# Patient Record
Sex: Female | Born: 1990 | Race: White | Hispanic: No | Marital: Single | Smoking: Never smoker
Health system: Southern US, Community
[De-identification: ages and names within clinical notes are randomized; demographics above are authoritative.]

## PROBLEM LIST (undated history)

## (undated) DIAGNOSIS — D649 Anemia, unspecified: Secondary | ICD-10-CM

## (undated) DIAGNOSIS — R000 Tachycardia, unspecified: Secondary | ICD-10-CM

## (undated) DIAGNOSIS — J45909 Unspecified asthma, uncomplicated: Secondary | ICD-10-CM

## (undated) DIAGNOSIS — Z9889 Other specified postprocedural states: Secondary | ICD-10-CM

## (undated) DIAGNOSIS — N189 Chronic kidney disease, unspecified: Secondary | ICD-10-CM

## (undated) DIAGNOSIS — G90A Postural orthostatic tachycardia syndrome (POTS): Secondary | ICD-10-CM

## (undated) DIAGNOSIS — I498 Other specified cardiac arrhythmias: Secondary | ICD-10-CM

## (undated) DIAGNOSIS — I951 Orthostatic hypotension: Secondary | ICD-10-CM

## (undated) DIAGNOSIS — R112 Nausea with vomiting, unspecified: Secondary | ICD-10-CM

## (undated) DIAGNOSIS — R51 Headache: Secondary | ICD-10-CM

## (undated) DIAGNOSIS — C801 Malignant (primary) neoplasm, unspecified: Secondary | ICD-10-CM

## (undated) DIAGNOSIS — R519 Headache, unspecified: Secondary | ICD-10-CM

## (undated) DIAGNOSIS — Z8679 Personal history of other diseases of the circulatory system: Secondary | ICD-10-CM

---
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
## 2013-10-29 DIAGNOSIS — H9191 Unspecified hearing loss, right ear: Secondary | ICD-10-CM | POA: Insufficient documentation

## 2013-11-04 ENCOUNTER — Ambulatory Visit: Payer: Self-pay | Admitting: Internal Medicine

## 2014-08-01 ENCOUNTER — Ambulatory Visit: Payer: Self-pay | Admitting: Neurology

## 2015-05-05 ENCOUNTER — Other Ambulatory Visit: Payer: Self-pay | Admitting: Surgery

## 2015-05-05 DIAGNOSIS — M25561 Pain in right knee: Secondary | ICD-10-CM

## 2015-05-26 ENCOUNTER — Ambulatory Visit
Admission: RE | Admit: 2015-05-26 | Discharge: 2015-05-26 | Disposition: A | Discharge status: Home / Self Care | Payer: BC Managed Care – PPO | Source: Ambulatory Visit | Attending: Surgery | Admitting: Surgery

## 2015-05-26 DIAGNOSIS — M2241 Chondromalacia patellae, right knee: Secondary | ICD-10-CM | POA: Insufficient documentation

## 2015-05-26 DIAGNOSIS — M25561 Pain in right knee: Secondary | ICD-10-CM | POA: Insufficient documentation

## 2015-06-06 ENCOUNTER — Encounter
Admission: RE | Admit: 2015-06-06 | Discharge: 2015-06-06 | Disposition: A | Discharge status: Home / Self Care | Payer: BC Managed Care – PPO | Source: Ambulatory Visit | Attending: Surgery | Admitting: Surgery

## 2015-06-06 DIAGNOSIS — I498 Other specified cardiac arrhythmias: Secondary | ICD-10-CM | POA: Diagnosis not present

## 2015-06-06 DIAGNOSIS — Z79899 Other long term (current) drug therapy: Secondary | ICD-10-CM | POA: Diagnosis not present

## 2015-06-06 DIAGNOSIS — Z8349 Family history of other endocrine, nutritional and metabolic diseases: Secondary | ICD-10-CM | POA: Diagnosis not present

## 2015-06-06 DIAGNOSIS — Z833 Family history of diabetes mellitus: Secondary | ICD-10-CM | POA: Diagnosis not present

## 2015-06-06 DIAGNOSIS — Z87892 Personal history of anaphylaxis: Secondary | ICD-10-CM | POA: Diagnosis not present

## 2015-06-06 DIAGNOSIS — Z823 Family history of stroke: Secondary | ICD-10-CM | POA: Diagnosis not present

## 2015-06-06 DIAGNOSIS — Z9889 Other specified postprocedural states: Secondary | ICD-10-CM | POA: Diagnosis not present

## 2015-06-06 DIAGNOSIS — X58XXXA Exposure to other specified factors, initial encounter: Secondary | ICD-10-CM | POA: Diagnosis not present

## 2015-06-06 DIAGNOSIS — Z8249 Family history of ischemic heart disease and other diseases of the circulatory system: Secondary | ICD-10-CM | POA: Diagnosis not present

## 2015-06-06 DIAGNOSIS — Z881 Allergy status to other antibiotic agents status: Secondary | ICD-10-CM | POA: Diagnosis not present

## 2015-06-06 DIAGNOSIS — Z85820 Personal history of malignant melanoma of skin: Secondary | ICD-10-CM | POA: Diagnosis not present

## 2015-06-06 DIAGNOSIS — Z91041 Radiographic dye allergy status: Secondary | ICD-10-CM | POA: Diagnosis not present

## 2015-06-06 DIAGNOSIS — Z832 Family history of diseases of the blood and blood-forming organs and certain disorders involving the immune mechanism: Secondary | ICD-10-CM | POA: Diagnosis not present

## 2015-06-06 DIAGNOSIS — S83221A Peripheral tear of medial meniscus, current injury, right knee, initial encounter: Secondary | ICD-10-CM | POA: Diagnosis not present

## 2015-06-06 HISTORY — DX: Postural orthostatic tachycardia syndrome (POTS): G90.A

## 2015-06-06 HISTORY — DX: Tachycardia, unspecified: R00.0

## 2015-06-06 HISTORY — DX: Other specified cardiac arrhythmias: I49.8

## 2015-06-06 HISTORY — DX: Orthostatic hypotension: I95.1

## 2015-06-06 HISTORY — DX: Nausea with vomiting, unspecified: R11.2

## 2015-06-06 HISTORY — DX: Personal history of other diseases of the circulatory system: Z86.79

## 2015-06-06 HISTORY — DX: Malignant (primary) neoplasm, unspecified: C80.1

## 2015-06-06 HISTORY — DX: Headache, unspecified: R51.9

## 2015-06-06 HISTORY — DX: Headache: R51

## 2015-06-06 HISTORY — DX: Anemia, unspecified: D64.9

## 2015-06-06 HISTORY — DX: Unspecified asthma, uncomplicated: J45.909

## 2015-06-06 HISTORY — DX: Other specified postprocedural states: Z98.890

## 2015-06-06 HISTORY — DX: Chronic kidney disease, unspecified: N18.9

## 2015-06-06 LAB — HEMOGLOBIN: Hemoglobin: 13.1 g/dL (ref 12.0–16.0)

## 2015-06-06 NOTE — Patient Instructions (Signed)
  Your procedure is scheduled on: December 22,2106 (Thursday) Report to Day Surgery.[facility]) Second Floor To find out your arrival time please call [phone] between 1PM - 3PM on June 07, 2015 (Wednesday).  Remember: Instructions that are not followed completely may result in serious medical risk, up to and including death, or upon the discretion of your surgeon and anesthesiologist your surgery may need to be rescheduled.    __x__ 1. Do not eat food or drink liquids after midnight. No gum chewing or hard candies.     __x__ 2. No Alcohol for 24 hours before or after surgery.   ____ 3. Bring all medications with you on the day of surgery if instructed.    __x__ 4. Notify your doctor if there is any change in your medical condition     (cold, fever, infections).     Do not wear jewelry, make-up, hairpins, clips or nail polish.  Do not wear lotions, powders, or perfumes. You may wear deodorant.  Do not shave 48 hours prior to surgery. Men may shave face and neck.  Do not bring valuables to the hospital.    [facility] is not responsible for any belongings or valuables.               Contacts, dentures or bridgework may not be worn into surgery.  Leave your suitcase in the car. After surgery it may be brought to your room.  For patients admitted to the hospital, discharge time is determined by your                treatment team.   Patients discharged the day of surgery will not be allowed to drive home.   Please read over the following fact sheets that you were given:   Surgical Site Infection Prevention   __x__ Take these medicines the morning of surgery with A SIP OF WATER:    1. Propranolol  2.   3.   4.  5.  6.  ____ Fleet Enema (as directed)   _x__ Use CHG Soap as directed  ____ Use inhalers on the day of surgery  ____ Stop metformin 2 days prior to surgery    ____ Take 1/2 of usual insulin dose the night before surgery and none on the morning of  surgery.   ____ Stop Coumadin/Plavix/aspirin on   __x_ Stop Anti-inflammatories on (Stop Ibuprofen now)   __x_ Stop supplements until after surgery.  (Stop Cranberry and Vitamin B-12 now)  ____ Bring C-Pap to the hospital.

## 2015-06-08 ENCOUNTER — Encounter: Payer: Self-pay | Admitting: *Deleted

## 2015-06-08 ENCOUNTER — Encounter
Admission: RE | Disposition: A | Discharge status: Home / Self Care | Payer: Self-pay | Source: Ambulatory Visit | Attending: Surgery

## 2015-06-08 ENCOUNTER — Ambulatory Visit: Payer: BC Managed Care – PPO | Admitting: Certified Registered Nurse Anesthetist

## 2015-06-08 ENCOUNTER — Ambulatory Visit
Admission: RE | Admit: 2015-06-08 | Discharge: 2015-06-08 | Disposition: A | Discharge status: Home / Self Care | Payer: BC Managed Care – PPO | Source: Ambulatory Visit | Attending: Surgery | Admitting: Surgery

## 2015-06-08 DIAGNOSIS — X58XXXA Exposure to other specified factors, initial encounter: Secondary | ICD-10-CM | POA: Insufficient documentation

## 2015-06-08 DIAGNOSIS — Z85820 Personal history of malignant melanoma of skin: Secondary | ICD-10-CM | POA: Insufficient documentation

## 2015-06-08 DIAGNOSIS — Z832 Family history of diseases of the blood and blood-forming organs and certain disorders involving the immune mechanism: Secondary | ICD-10-CM | POA: Insufficient documentation

## 2015-06-08 DIAGNOSIS — Z823 Family history of stroke: Secondary | ICD-10-CM | POA: Insufficient documentation

## 2015-06-08 DIAGNOSIS — I498 Other specified cardiac arrhythmias: Secondary | ICD-10-CM | POA: Insufficient documentation

## 2015-06-08 DIAGNOSIS — Z9889 Other specified postprocedural states: Secondary | ICD-10-CM | POA: Insufficient documentation

## 2015-06-08 DIAGNOSIS — Z8249 Family history of ischemic heart disease and other diseases of the circulatory system: Secondary | ICD-10-CM | POA: Insufficient documentation

## 2015-06-08 DIAGNOSIS — Z79899 Other long term (current) drug therapy: Secondary | ICD-10-CM | POA: Insufficient documentation

## 2015-06-08 DIAGNOSIS — S83221A Peripheral tear of medial meniscus, current injury, right knee, initial encounter: Secondary | ICD-10-CM | POA: Insufficient documentation

## 2015-06-08 DIAGNOSIS — Z833 Family history of diabetes mellitus: Secondary | ICD-10-CM | POA: Insufficient documentation

## 2015-06-08 DIAGNOSIS — Z881 Allergy status to other antibiotic agents status: Secondary | ICD-10-CM | POA: Insufficient documentation

## 2015-06-08 DIAGNOSIS — Z87892 Personal history of anaphylaxis: Secondary | ICD-10-CM | POA: Insufficient documentation

## 2015-06-08 DIAGNOSIS — Z8349 Family history of other endocrine, nutritional and metabolic diseases: Secondary | ICD-10-CM | POA: Insufficient documentation

## 2015-06-08 DIAGNOSIS — Z91041 Radiographic dye allergy status: Secondary | ICD-10-CM | POA: Insufficient documentation

## 2015-06-08 HISTORY — PX: KNEE ARTHROSCOPY WITH MENISCAL REPAIR: SHX5653

## 2015-06-08 LAB — POCT PREGNANCY, URINE: Preg Test, Ur: NEGATIVE

## 2015-06-08 SURGERY — ARTHROSCOPY, KNEE, WITH MENISCUS REPAIR
Anesthesia: General | Site: Knee | Laterality: Right | Wound class: Clean

## 2015-06-08 MED ORDER — EPHEDRINE SULFATE 50 MG/ML IJ SOLN
INTRAMUSCULAR | Status: DC | PRN
  Administered 2015-06-08: 5 mg via INTRAVENOUS

## 2015-06-08 MED ORDER — HYDROCODONE-ACETAMINOPHEN 5-325 MG PO TABS: 1.0000 | ORAL_TABLET | Freq: Four times a day (QID) | ORAL | Status: DC | PRN

## 2015-06-08 MED ORDER — PROPOFOL 10 MG/ML IV BOLUS
INTRAVENOUS | Status: DC | PRN
  Administered 2015-06-08: 150 mg via INTRAVENOUS

## 2015-06-08 MED ORDER — LIDOCAINE HCL (CARDIAC) 20 MG/ML IV SOLN
INTRAVENOUS | Status: DC | PRN
  Administered 2015-06-08: 100 mg via INTRAVENOUS

## 2015-06-08 MED ORDER — ONDANSETRON HCL 4 MG/2ML IJ SOLN: 4.0000 mg | Freq: Once | INTRAMUSCULAR | Status: DC | PRN

## 2015-06-08 MED ORDER — ONDANSETRON HCL 4 MG PO TABS: 4.0000 mg | ORAL_TABLET | Freq: Four times a day (QID) | ORAL | Status: DC | PRN

## 2015-06-08 MED ORDER — FENTANYL CITRATE (PF) 100 MCG/2ML IJ SOLN
INTRAMUSCULAR | Status: DC | PRN
  Administered 2015-06-08 (×2): 50 ug via INTRAVENOUS

## 2015-06-08 MED ORDER — PHENYLEPHRINE HCL 10 MG/ML IJ SOLN
INTRAMUSCULAR | Status: DC | PRN
  Administered 2015-06-08: 100 ug via INTRAVENOUS

## 2015-06-08 MED ORDER — SCOPOLAMINE 1 MG/3DAYS TD PT72
MEDICATED_PATCH | TRANSDERMAL | Status: AC
  Administered 2015-06-08: 1.5 mg via TRANSDERMAL
  Filled 2015-06-08: qty 1

## 2015-06-08 MED ORDER — MIDAZOLAM HCL 2 MG/2ML IJ SOLN
INTRAMUSCULAR | Status: DC | PRN
  Administered 2015-06-08: 2 mg via INTRAVENOUS

## 2015-06-08 MED ORDER — METOCLOPRAMIDE HCL 5 MG/ML IJ SOLN: 5.0000 mg | Freq: Three times a day (TID) | INTRAMUSCULAR | Status: DC | PRN

## 2015-06-08 MED ORDER — LACTATED RINGERS IV SOLN
INTRAVENOUS | Status: DC
  Administered 2015-06-08: 75 mL/h via INTRAVENOUS

## 2015-06-08 MED ORDER — ONDANSETRON HCL 4 MG/2ML IJ SOLN: 4.0000 mg | Freq: Four times a day (QID) | INTRAMUSCULAR | Status: DC | PRN

## 2015-06-08 MED ORDER — ACETAMINOPHEN 10 MG/ML IV SOLN
INTRAVENOUS | Status: DC | PRN
  Administered 2015-06-08: 1000 mg via INTRAVENOUS

## 2015-06-08 MED ORDER — ACETAMINOPHEN 10 MG/ML IV SOLN
INTRAVENOUS | Status: AC
  Filled 2015-06-08: qty 100

## 2015-06-08 MED ORDER — BUPIVACAINE-EPINEPHRINE (PF) 0.5% -1:200000 IJ SOLN
INTRAMUSCULAR | Status: DC | PRN
  Administered 2015-06-08: 15 mL via PERINEURAL
  Administered 2015-06-08: 30 mL via PERINEURAL

## 2015-06-08 MED ORDER — FAMOTIDINE 20 MG PO TABS
20.0000 mg | ORAL_TABLET | Freq: Once | ORAL | Status: AC
  Administered 2015-06-08: 20 mg via ORAL

## 2015-06-08 MED ORDER — HYDROCODONE-ACETAMINOPHEN 5-325 MG PO TABS: 1.0000 | ORAL_TABLET | ORAL | Status: DC | PRN

## 2015-06-08 MED ORDER — LIDOCAINE HCL (PF) 1 % IJ SOLN
INTRAMUSCULAR | Status: AC
  Filled 2015-06-08: qty 2

## 2015-06-08 MED ORDER — CLINDAMYCIN PHOSPHATE 900 MG/50ML IV SOLN
INTRAVENOUS | Status: AC
  Filled 2015-06-08: qty 50

## 2015-06-08 MED ORDER — POTASSIUM CHLORIDE IN NACL 20-0.9 MEQ/L-% IV SOLN: INTRAVENOUS | Status: DC

## 2015-06-08 MED ORDER — LIDOCAINE HCL (PF) 1 % IJ SOLN
INTRAMUSCULAR | Status: AC
  Filled 2015-06-08: qty 30

## 2015-06-08 MED ORDER — METOCLOPRAMIDE HCL 10 MG PO TABS
5.0000 mg | ORAL_TABLET | Freq: Three times a day (TID) | ORAL | Status: DC | PRN
Start: 2015-06-08 — End: 2015-06-08

## 2015-06-08 MED ORDER — DEXAMETHASONE SODIUM PHOSPHATE 10 MG/ML IJ SOLN
INTRAMUSCULAR | Status: DC | PRN
  Administered 2015-06-08: 5 mg via INTRAVENOUS

## 2015-06-08 MED ORDER — ONDANSETRON HCL 4 MG/2ML IJ SOLN
INTRAMUSCULAR | Status: DC | PRN
  Administered 2015-06-08: 4 mg via INTRAVENOUS

## 2015-06-08 MED ORDER — FAMOTIDINE 20 MG PO TABS
ORAL_TABLET | ORAL | Status: AC
  Administered 2015-06-08: 20 mg via ORAL
  Filled 2015-06-08: qty 1

## 2015-06-08 MED ORDER — BUPIVACAINE-EPINEPHRINE (PF) 0.5% -1:200000 IJ SOLN
INTRAMUSCULAR | Status: AC
  Filled 2015-06-08: qty 60

## 2015-06-08 MED ORDER — CLINDAMYCIN PHOSPHATE 900 MG/50ML IV SOLN
900.0000 mg | Freq: Once | INTRAVENOUS | Status: AC
Start: 2015-06-08 — End: 2015-06-08
  Administered 2015-06-08: 900 mg via INTRAVENOUS

## 2015-06-08 MED ORDER — IBUPROFEN 200 MG PO TABS: 600.0000 mg | ORAL_TABLET | Freq: Four times a day (QID) | ORAL | Status: DC | PRN

## 2015-06-08 MED ORDER — LIDOCAINE HCL 1 % IJ SOLN
INTRAMUSCULAR | Status: DC | PRN
  Administered 2015-06-08: 30 mL

## 2015-06-08 MED ORDER — SCOPOLAMINE 1 MG/3DAYS TD PT72
1.0000 | MEDICATED_PATCH | TRANSDERMAL | Status: DC
  Administered 2015-06-08: 1.5 mg via TRANSDERMAL

## 2015-06-08 MED ORDER — FENTANYL CITRATE (PF) 100 MCG/2ML IJ SOLN: 25.0000 ug | INTRAMUSCULAR | Status: DC | PRN

## 2015-06-08 SURGICAL SUPPLY — 35 items
BAG COUNTER SPONGE EZ (MISCELLANEOUS) IMPLANT
BANDAGE ACE 6X5 VEL STRL LF (GAUZE/BANDAGES/DRESSINGS) ×3 IMPLANT
BLADE FULL RADIUS 3.5 (BLADE) ×3 IMPLANT
BLADE SHAVER 4.5X7 STR FR (MISCELLANEOUS) ×3 IMPLANT
CHLORAPREP W/TINT 26ML (MISCELLANEOUS) ×3 IMPLANT
COUNTER SPONGE BAG EZ (MISCELLANEOUS)
FASTFIX NDL DEL SYS 360 CVD (Miscellaneous) ×9 IMPLANT
FASTFIX NDL DEL SYS 360 STRT (Miscellaneous) ×6 IMPLANT
GAUZE SPONGE 4X4 12PLY STRL (GAUZE/BANDAGES/DRESSINGS) ×3 IMPLANT
GLOVE BIO SURGEON STRL SZ8 (GLOVE) ×6 IMPLANT
GLOVE BIOGEL M 7.0 STRL (GLOVE) ×6 IMPLANT
GLOVE BIOGEL PI IND STRL 7.5 (GLOVE) ×1 IMPLANT
GLOVE BIOGEL PI INDICATOR 7.5 (GLOVE) ×2
GLOVE INDICATOR 8.0 STRL GRN (GLOVE) ×3 IMPLANT
GOWN STRL REUS W/ TWL LRG LVL3 (GOWN DISPOSABLE) ×1 IMPLANT
GOWN STRL REUS W/ TWL XL LVL3 (GOWN DISPOSABLE) ×2 IMPLANT
GOWN STRL REUS W/TWL LRG LVL3 (GOWN DISPOSABLE) ×2
GOWN STRL REUS W/TWL XL LVL3 (GOWN DISPOSABLE) ×4
IV LACTATED RINGER IRRG 3000ML (IV SOLUTION) ×2
IV LR IRRIG 3000ML ARTHROMATIC (IV SOLUTION) ×1 IMPLANT
KIT RM TURNOVER STRD PROC AR (KITS) ×3 IMPLANT
KNOT PUSHER IMPLANT
MANIFOLD NEPTUNE II (INSTRUMENTS) ×3 IMPLANT
NEEDLE HYPO 21X1.5 SAFETY (NEEDLE) ×3 IMPLANT
PACK ARTHROSCOPY KNEE (MISCELLANEOUS) ×3 IMPLANT
PAD GROUND ADULT SPLIT (MISCELLANEOUS) ×3 IMPLANT
PENCIL ELECTRO HAND CTR (MISCELLANEOUS) ×3 IMPLANT
PUSHER KNOT ARTHRO STRT FASTFI (Miscellaneous) ×3 IMPLANT
SUT PROLENE 4 0 PS 2 18 (SUTURE) ×3 IMPLANT
SUT TI-CRON 2-0 W/10 SWGD (SUTURE) IMPLANT
SYR 50ML LL SCALE MARK (SYRINGE) ×3 IMPLANT
SYSTEM NDL DEL FSTFX  360 CVD (Miscellaneous) ×3 IMPLANT
SYSTEM NDL DEL FSTFX  360 STRT (Miscellaneous) ×2 IMPLANT
TUBING ARTHRO INFLOW-ONLY STRL (TUBING) ×3 IMPLANT
WAND HAND CNTRL MULTIVAC 90 (MISCELLANEOUS) ×3 IMPLANT

## 2015-06-08 NOTE — Anesthesia Preprocedure Evaluation (Signed)
Anesthesia Evaluation  Patient identified by MRN, date of birth, ID band Patient awake    Reviewed: Allergy & Precautions, NPO status , Patient's Chart, lab work & pertinent test results  History of Anesthesia Complications (+) PONV  Airway Mallampati: II       Dental  (+) Teeth Intact   Pulmonary    breath sounds clear to auscultation       Cardiovascular Exercise Tolerance: Good + dysrhythmias Supra Ventricular Tachycardia  Rhythm:Regular Rate:Normal     Neuro/Psych    GI/Hepatic negative GI ROS, Neg liver ROS,   Endo/Other    Renal/GU      Musculoskeletal   Abdominal Normal abdominal exam  (+)   Peds  Hematology  (+) anemia ,   Anesthesia Other Findings   Reproductive/Obstetrics                             Anesthesia Physical Anesthesia Plan  ASA: II  Anesthesia Plan: General   Post-op Pain Management:    Induction: Intravenous  Airway Management Planned: LMA  Additional Equipment:   Intra-op Plan:   Post-operative Plan: Extubation in OR  Informed Consent: I have reviewed the patients History and Physical, chart, labs and discussed the procedure including the risks, benefits and alternatives for the proposed anesthesia with the patient or authorized representative who has indicated his/her understanding and acceptance.     Plan Discussed with: CRNA  Anesthesia Plan Comments:         Anesthesia Quick Evaluation

## 2015-06-08 NOTE — Anesthesia Postprocedure Evaluation (Signed)
Anesthesia Post Note  Patient: Sarah Cohen  Procedure(s) Performed: Procedure(s) (LRB): KNEE ARTHROSCOPY WITH MEDIAL  MENISCAL REPAIR (Right)  Patient location during evaluation: PACU Anesthesia Type: General Level of consciousness: awake Pain management: pain level controlled Vital Signs Assessment: post-procedure vital signs reviewed and stable Respiratory status: spontaneous breathing Cardiovascular status: stable    Last Vitals:  Filed Vitals:   06/08/15 1024 06/08/15 1054  BP: 106/76 113/83  Pulse: 63 78  Temp: 36.5 C   Resp: 16 16    Last Pain:  Filed Vitals:   06/08/15 1103  PainSc: 3                  VAN STAVEREN,Gresia Isidoro

## 2015-06-08 NOTE — H&P (Signed)
Paper H&P to be scanned into permanent record. H&P reviewed. No changes. 

## 2015-06-08 NOTE — Anesthesia Procedure Notes (Signed)
Procedure Name: LMA Insertion Date/Time: 06/08/2015 8:28 AM Performed by: Johnna Acosta Pre-anesthesia Checklist: Patient identified, Emergency Drugs available, Suction available and Patient being monitored Patient Re-evaluated:Patient Re-evaluated prior to inductionOxygen Delivery Method: Circle system utilized Preoxygenation: Pre-oxygenation with 100% oxygen Intubation Type: IV induction LMA: LMA inserted LMA Size: 3.5 Tube type: Oral Number of attempts: 1 Placement Confirmation: ETT inserted through vocal cords under direct vision and positive ETCO2 Tube secured with: Tape Dental Injury: Teeth and Oropharynx as per pre-operative assessment

## 2015-06-08 NOTE — Transfer of Care (Signed)
Immediate Anesthesia Transfer of Care Note  Patient: Sarah Cohen  Procedure(s) Performed: Procedure(s): KNEE ARTHROSCOPY WITH MEDIAL  MENISCAL REPAIR (Right)  Patient Location: PACU  Anesthesia Type:General  Level of Consciousness: awake and alert   Airway & Oxygen Therapy: Patient Spontanous Breathing and Patient connected to nasal cannula oxygen  Post-op Assessment: Report given to RN and Post -op Vital signs reviewed and stable  Post vital signs: Reviewed and stable  Last Vitals:  Filed Vitals:   06/08/15 0733  BP: 116/81  Pulse: 71  Temp: 123XX123 C    Complications: No apparent anesthesia complications

## 2015-06-08 NOTE — Op Note (Signed)
06/08/2015  9:30 AM  Patient:   Sarah Cohen  Pre-Op Diagnosis:   Peripheral tear of medial meniscus, right knee.  Postoperative diagnosis:   Same.  Procedure:   Arthroscopic medial meniscus repair, right knee.  Surgeon:   Pascal Lux, M.D.  Anesthesia:   General LMA.  Findings:   As above. The lateral meniscus was in excellent condition, as were the anterior and posterior cruciate ligaments. The articular surfaces of the femur, the tibia, and the patella all were in satisfactory condition.  Complications:   None.  EBL:   2 cc.  Total fluids:   700 cc of crystalloid.  Tourniquet time:   None  Drains:   None  Closure:   4-0 Prolene interrupted sutures.  Brief clinical note:   The patient is a 24 year old female with a several month history of medial sided right knee pain following a twisting injury. Her symptoms have persisted despite medications, activity modification, etc. Her history and examination were consistent with a medial meniscus tear. An MRI scan demonstrated a peripheral menisco-capsular disruption involving the postero-medial portion of the medial meniscus. The patient presents at this time for arthroscopy, debridement, and repair versus partial medial meniscectomy.  Procedure:   The patient was brought into the operating room and lain in the supine position. After adequate general laryngal mask anesthesia was obtained, a timeout was performed to verify the appropriate side. The patient's right knee was injected sterilely using a solution of 30 cc of 1% lidocaine and 30 cc of 0.5% Sensorcaine with epinephrine. The right lower extremity was prepped with ChloraPrep solution before being draped sterilely. Preoperative antibiotics were administered. The expected portal sites were injected with 0.5% Sensorcaine with epinephrine before the camera was placed in the anterolateral portal and instrumentation performed through the anteromedial portal. The knee was  sequentially examined beginning in the suprapatellar pouch, then progressing to the patellofemoral space, the medial gutter compartment, the notch, and finally the lateral compartment and gutter. The findings were as described above. Abundant reactive synovial tissues anteriorly were debrided using the full-radius resector in order to improve visualization. The medial meniscus was carefully probed, confirming the above-noted findings. The menisco-capsular detachment site was roughened with a full-radius resector and a meniscal rasp to stimulate bleeding before the meniscus was repaired using several FasT-Fix 360 meniscal repair devices. Subsequent probing of the repair demonstrated excellent stability. The instruments were removed from the joint after suctioning the excess fluid. The portal sites were closed using 4-0 Prolene interrupted sutures before a sterile bulky dressing was applied to the knee. The patient was then awakened, extubated, and returned to the recovery room in satisfactory condition after tolerating the procedure well.

## 2015-06-08 NOTE — Discharge Instructions (Addendum)
Keep dressing dry and intact.  May shower after dressing changed on post-op day #4 (Monday).  Cover sutures with Band-Aids after drying off. Apply ice frequently to knee. May weight-bear as tolerated - use crutches as needed. Follow-up in 10-14 days or as scheduled.   General Anesthesia, Adult, Care After Refer to this sheet in the next few weeks. These instructions provide you with information on caring for yourself after your procedure. Your health care provider may also give you more specific instructions. Your treatment has been planned according to current medical practices, but problems sometimes occur. Call your health care provider if you have any problems or questions after your procedure. WHAT TO EXPECT AFTER THE PROCEDURE After the procedure, it is typical to experience:  Sleepiness.  Nausea and vomiting. HOME CARE INSTRUCTIONS  For the first 24 hours after general anesthesia:  Have a responsible person with you.  Do not drive a car. If you are alone, do not take public transportation.  Do not drink alcohol.  Do not take medicine that has not been prescribed by your health care provider.  Do not sign important papers or make important decisions.  You may resume a normal diet and activities as directed by your health care provider.  Change bandages (dressings) as directed.  If you have questions or problems that seem related to general anesthesia, call the hospital and ask for the anesthetist or anesthesiologist on call. SEEK MEDICAL CARE IF:  You have nausea and vomiting that continue the day after anesthesia.  You develop a rash. SEEK IMMEDIATE MEDICAL CARE IF:   You have difficulty breathing.  You have chest pain.  You have any allergic problems.   This information is not intended to replace advice given to you by your health care provider. Make sure you discuss any questions you have with your health care provider.   Document Released: 09/09/2000  Document Revised: 06/24/2014 Document Reviewed: 10/02/2011 Elsevier Interactive Patient Education 2016 [place] Anesthesia, Adult, Care After Refer to this sheet in the next few weeks. These instructions provide you with information on caring for yourself after your procedure. Your health care provider may also give you more specific instructions. Your treatment has been planned according to current medical practices, but problems sometimes occur. Call your health care provider if you have any problems or questions after your procedure. WHAT TO EXPECT AFTER THE PROCEDURE After the procedure, it is typical to experience:  Sleepiness.  Nausea and vomiting. HOME CARE INSTRUCTIONS  For the first 24 hours after general anesthesia:  Have a responsible person with you.  Do not drive a car. If you are alone, do not take public transportation.  Do not drink alcohol.  Do not take medicine that has not been prescribed by your health care provider.  Do not sign important papers or make important decisions.  You may resume a normal diet and activities as directed by your health care provider.  Change bandages (dressings) as directed.  If you have questions or problems that seem related to general anesthesia, call the hospital and ask for the anesthetist or anesthesiologist on call. SEEK MEDICAL CARE IF:  You have nausea and vomiting that continue the day after anesthesia.  You develop a rash. SEEK IMMEDIATE MEDICAL CARE IF:   You have difficulty breathing.  You have chest pain.  You have any allergic problems.   This information is not intended to replace advice given to you by your health care provider. Make sure you  discuss any questions you have with your health care provider.   Document Released: 09/09/2000 Document Revised: 06/24/2014 Document Reviewed: 10/02/2011 Elsevier Interactive Patient Education [employer].

## 2016-06-28 ENCOUNTER — Other Ambulatory Visit: Payer: Self-pay | Admitting: Internal Medicine

## 2016-06-28 ENCOUNTER — Ambulatory Visit
Admission: RE | Admit: 2016-06-28 | Discharge: 2016-06-28 | Disposition: A | Discharge status: Home / Self Care | Payer: BC Managed Care – PPO | Source: Ambulatory Visit | Attending: Internal Medicine | Admitting: Internal Medicine

## 2016-06-28 DIAGNOSIS — R1011 Right upper quadrant pain: Secondary | ICD-10-CM | POA: Diagnosis present

## 2016-07-05 ENCOUNTER — Encounter: Payer: Self-pay | Admitting: Intensive Care

## 2016-07-05 ENCOUNTER — Emergency Department: Payer: BC Managed Care – PPO

## 2016-07-05 ENCOUNTER — Emergency Department
Admission: EM | Admit: 2016-07-05 | Discharge: 2016-07-05 | Disposition: A | Discharge status: Home / Self Care | Payer: BC Managed Care – PPO | Attending: Emergency Medicine | Admitting: Emergency Medicine

## 2016-07-05 DIAGNOSIS — Z791 Long term (current) use of non-steroidal anti-inflammatories (NSAID): Secondary | ICD-10-CM | POA: Insufficient documentation

## 2016-07-05 DIAGNOSIS — N200 Calculus of kidney: Secondary | ICD-10-CM | POA: Insufficient documentation

## 2016-07-05 DIAGNOSIS — R51 Headache: Secondary | ICD-10-CM | POA: Insufficient documentation

## 2016-07-05 DIAGNOSIS — R109 Unspecified abdominal pain: Secondary | ICD-10-CM | POA: Diagnosis present

## 2016-07-05 DIAGNOSIS — J45909 Unspecified asthma, uncomplicated: Secondary | ICD-10-CM | POA: Diagnosis not present

## 2016-07-05 DIAGNOSIS — N189 Chronic kidney disease, unspecified: Secondary | ICD-10-CM | POA: Diagnosis not present

## 2016-07-05 DIAGNOSIS — Z79899 Other long term (current) drug therapy: Secondary | ICD-10-CM | POA: Diagnosis not present

## 2016-07-05 DIAGNOSIS — R1011 Right upper quadrant pain: Secondary | ICD-10-CM

## 2016-07-05 LAB — URINALYSIS, COMPLETE (UACMP) WITH MICROSCOPIC
Bilirubin Urine: NEGATIVE
Glucose, UA: NEGATIVE mg/dL
Hgb urine dipstick: NEGATIVE
Ketones, ur: 20 mg/dL — AB
Leukocytes, UA: NEGATIVE
Nitrite: NEGATIVE
Protein, ur: NEGATIVE mg/dL
Specific Gravity, Urine: 1.013 (ref 1.005–1.030)
pH: 5 (ref 5.0–8.0)

## 2016-07-05 LAB — CBC WITH DIFFERENTIAL/PLATELET
Basophils Absolute: 0 10*3/uL (ref 0–0.1)
Basophils Relative: 0 %
EOS ABS: 0 10*3/uL (ref 0–0.7)
EOS PCT: 0 %
HCT: 35.5 % (ref 35.0–47.0)
HEMOGLOBIN: 12.6 g/dL (ref 12.0–16.0)
LYMPHS ABS: 0.3 10*3/uL — AB (ref 1.0–3.6)
Lymphocytes Relative: 7 %
MCH: 31.3 pg (ref 26.0–34.0)
MCHC: 35.5 g/dL (ref 32.0–36.0)
MCV: 88.3 fL (ref 80.0–100.0)
Monocytes Absolute: 0.4 10*3/uL (ref 0.2–0.9)
Monocytes Relative: 9 %
Neutro Abs: 4.2 10*3/uL (ref 1.4–6.5)
Neutrophils Relative %: 84 %
PLATELETS: 186 10*3/uL (ref 150–440)
RBC: 4.01 MIL/uL (ref 3.80–5.20)
RDW: 12.9 % (ref 11.5–14.5)
WBC: 5 10*3/uL (ref 3.6–11.0)

## 2016-07-05 LAB — COMPREHENSIVE METABOLIC PANEL
ALT: 21 U/L (ref 14–54)
AST: 31 U/L (ref 15–41)
Albumin: 3.9 g/dL (ref 3.5–5.0)
Alkaline Phosphatase: 28 U/L — ABNORMAL LOW (ref 38–126)
Anion gap: 8 (ref 5–15)
BUN: 6 mg/dL (ref 6–20)
CO2: 22 mmol/L (ref 22–32)
Calcium: 8.8 mg/dL — ABNORMAL LOW (ref 8.9–10.3)
Chloride: 105 mmol/L (ref 101–111)
Creatinine, Ser: 0.59 mg/dL (ref 0.44–1.00)
GFR calc Af Amer: >60 mL/min (ref >60)
GFR calc non Af Amer: >60 mL/min (ref >60)
Glucose, Bld: 102 mg/dL — ABNORMAL HIGH (ref 65–99)
Potassium: 3.4 mmol/L — ABNORMAL LOW (ref 3.5–5.1)
Sodium: 135 mmol/L (ref 135–145)
Total Bilirubin: 0.6 mg/dL (ref 0.3–1.2)
Total Protein: 6.6 g/dL (ref 6.5–8.1)

## 2016-07-05 LAB — LIPASE, BLOOD: Lipase: 21 U/L (ref 11–51)

## 2016-07-05 LAB — MONONUCLEOSIS SCREEN: MONO SCREEN: NEGATIVE

## 2016-07-05 LAB — POCT PREGNANCY, URINE: Preg Test, Ur: NEGATIVE

## 2016-07-05 MED ORDER — HYDROCODONE-ACETAMINOPHEN 5-325 MG PO TABS: 1.0000 | ORAL_TABLET | Freq: Four times a day (QID) | ORAL | 0 refills | Status: DC | PRN

## 2016-07-05 MED ORDER — KETOROLAC TROMETHAMINE 30 MG/ML IJ SOLN
30.0000 mg | Freq: Once | INTRAMUSCULAR | Status: AC
  Administered 2016-07-05: 30 mg via INTRAVENOUS
  Filled 2016-07-05: qty 1

## 2016-07-05 MED ORDER — ONDANSETRON HCL 4 MG PO TABS: 4.0000 mg | ORAL_TABLET | Freq: Three times a day (TID) | ORAL | 0 refills | Status: AC | PRN

## 2016-07-05 MED ORDER — ONDANSETRON HCL 4 MG/2ML IJ SOLN
4.0000 mg | Freq: Once | INTRAMUSCULAR | Status: AC
  Administered 2016-07-05: 4 mg via INTRAVENOUS
  Filled 2016-07-05: qty 2

## 2016-07-05 MED ORDER — HYDROMORPHONE HCL 1 MG/ML IJ SOLN: 1.0000 mg | Freq: Once | INTRAMUSCULAR | Status: DC

## 2016-07-05 MED ORDER — SODIUM CHLORIDE 0.9 % IV BOLUS (SEPSIS)
1000.0000 mL | Freq: Once | INTRAVENOUS | Status: AC
  Administered 2016-07-05: 1000 mL via INTRAVENOUS

## 2016-07-05 MED ORDER — MORPHINE SULFATE (PF) 4 MG/ML IV SOLN
4.0000 mg | Freq: Once | INTRAVENOUS | Status: AC
  Administered 2016-07-05: 4 mg via INTRAVENOUS
  Filled 2016-07-05: qty 1

## 2016-07-05 NOTE — Discharge Instructions (Signed)
Stay hydrated.   Take zofran for nausea.   Continue motrin as needed for pain.   Take vicodin for severe pain. DO NOT drive with it.   You have some nonspecific swollen lymph nodes in the abdomen.   See GI for follow up. Call office on Monday for appointment   Return to ER if you have severe abdominal pain, vomiting, fevers.

## 2016-07-05 NOTE — ED Triage Notes (Signed)
Patient reports starting Cipro on Wednesday for a possible UTI by her PCP. Patient states she is only able to take half pills of the Cipro due to nausea. YEsterday after starting cipro pt c/o throat pain, 101 fever, and headache. Patient ambulatory back to Er room with NAD noted. Dr. Ouida Sills is who mom spoke with and was told to come to ER.

## 2016-07-05 NOTE — ED Notes (Signed)
Pt reports recent UTI, started taking cipro 2 days ago and since has felt like her throat was swollen, ha, right flank pain rad into leg.  Stopped taking it today.  No resp distress, talking in complete sentences, no drooling.

## 2016-07-05 NOTE — ED Triage Notes (Signed)
Pt being seen by Dr Ouida Sills, placed on antibiotics for abdominal pain. Pt reports increased complaints of fever, flank pain and dizziness since.

## 2016-07-05 NOTE — ED Provider Notes (Signed)
[facility] Provider Note   CSN: [id number] Arrival date & time: 07/05/16  0759     History   Chief Complaint Chief Complaint  Patient presents with  . Urinary Tract Infection    HPI Sarah Cohen is a 26 y.o. female hx of POTS, previous R ureteral reflux, here with fever, R flank pain. Patient saw primary care doctor about a week ago and had UA that showed possible urinary tract infection. Patient was put on Cipro and the dose was doubled yesterday. She states that since then she has severe anxiety, nausea, sore throat. Also developed fever yesterday as well as some headaches. Has persistent right upper quadrant and right flank pain. Patient also had normal CBC, CMP, lipase, right upper quadrant ultrasound week ago.  The history is provided by the patient.    Past Medical History:  Diagnosis Date  . Anemia   . Asthma    childhood asthma  . Cancer ([place])    melanoma  . Chronic kidney disease    UTI  . Headache    migraines...propanolol helps or oils  . History of Raynaud's syndrome   . PONV (postoperative nausea and vomiting)   . POTS (postural orthostatic tachycardia syndrome)     There are no active problems to display for this patient.   Past Surgical History:  Procedure Laterality Date  . AV NODE ABLATION  2009  . HERNIA REPAIR  [id number]   Umbilical  . KNEE ARTHROSCOPY WITH MENISCAL REPAIR Right 06/08/2015   Procedure: KNEE ARTHROSCOPY WITH MEDIAL  MENISCAL REPAIR;  Surgeon: Corky Mull, MD;  Location: [facility] ORS;  Service: Orthopedics;  Laterality: Right;    OB History    No data available       Home Medications    Prior to Admission medications   Medication Sig Start Date End Date Taking? Authorizing Provider  Cholecalciferol (VITAMIN D3) 5000 UNITS CAPS Take 5,000 Units by mouth daily.    Historical Provider, MD  Cranberry 500 MG CAPS Take 1 capsule by mouth daily.    Historical Provider, MD  Cyanocobalamin (VITAMIN B-12) 5000 MCG  SUBL Place 1 tablet under the tongue daily.    Historical Provider, MD  HYDROcodone-acetaminophen (NORCO) 5-325 MG tablet Take 1-2 tablets by mouth every 6 (six) hours as needed for moderate pain. MAXIMUM TOTAL ACETAMINOPHEN DOSE IS 4000 MG PER DAY 06/08/15   Corky Mull, MD  ibuprofen (ADVIL,MOTRIN) 200 MG tablet Take 3-4 tablets (600-800 mg total) by mouth every 6 (six) hours as needed for moderate pain. 06/08/15   Corky Mull, MD  ondansetron (ZOFRAN-ODT) 4 MG disintegrating tablet Take 4 mg by mouth every 8 (eight) hours as needed for nausea or vomiting.    Historical Provider, MD  potassium chloride (K-DUR,KLOR-CON) 10 MEQ tablet Take 10 mEq by mouth every 30 (thirty) days.    Historical Provider, MD  PROPRANOLOL HCL ER PO Take 60 mg by mouth daily.    Historical Provider, MD  pseudoephedrine (SUDOGEST) 30 MG tablet Take 30 mg by mouth 3 (three) times daily.    Historical Provider, MD    Family History Family History  Problem Relation Age of Onset  . Thyroid cancer Mother     Social History Social History  Substance Use Topics  . Smoking status: Never Smoker  . Smokeless tobacco: Never Used  . Alcohol use Yes     Comment: social     Allergies   Cephalosporins and Ivp dye [iodinated diagnostic agents]  Review of Systems Review of Systems  Constitutional: Positive for fever.  Gastrointestinal: Positive for abdominal pain and nausea.  Neurological: Positive for headaches.  All other systems reviewed and are negative.    Physical Exam Updated Vital Signs BP 115/77   Pulse 94   Temp 99.7 F (37.6 C) (Oral)   Resp 18   Ht 5' 3.5" (1.613 m)   Wt 152 lb (68.9 kg)   LMP 06/04/2016   SpO2 100%   BMI 26.50 kg/m   Physical Exam  Constitutional: She is oriented to person, place, and time.  Uncomfortable, dehydrated   HENT:  Head: Normocephalic.  Right Ear: External ear normal.  Left Ear: External ear normal.  MM dry, OP clear   Eyes: EOM are normal. Pupils are  equal, round, and reactive to light.  Neck: Normal range of motion. Neck supple.  Cardiovascular: Normal rate, regular rhythm and normal heart sounds.   Pulmonary/Chest: Effort normal and breath sounds normal. No respiratory distress. She has no wheezes.  Abdominal: Soft. Bowel sounds are normal.  Mild R CVAT, RUQ tenderness, mild RLQ tenderness   Musculoskeletal: Normal range of motion.  Neurological: She is alert and oriented to person, place, and time. No cranial nerve deficit. Coordination normal.  Skin: Skin is warm.  Psychiatric: She has a normal mood and affect.  Nursing note and vitals reviewed.    ED Treatments / Results  Labs (all labs ordered are listed, but only abnormal results are displayed) Labs Reviewed  COMPREHENSIVE METABOLIC PANEL - Abnormal; Notable for the following:       Result Value   Potassium 3.4 (*)    Glucose, Bld 102 (*)    Calcium 8.8 (*)    Alkaline Phosphatase 28 (*)    All other components within normal limits  URINALYSIS, COMPLETE (UACMP) WITH MICROSCOPIC - Abnormal; Notable for the following:    Color, Urine YELLOW (*)    APPearance CLEAR (*)    Ketones, ur 20 (*)    Bacteria, UA FEW (*)    Squamous Epithelial / LPF 0-5 (*)    All other components within normal limits  CBC WITH DIFFERENTIAL/PLATELET - Abnormal; Notable for the following:    Lymphs Abs 0.3 (*)    All other components within normal limits  URINE CULTURE  LIPASE, BLOOD  MONONUCLEOSIS SCREEN  POC URINE PREG, ED  POCT PREGNANCY, URINE    EKG  EKG Interpretation None       Radiology Ct Renal Stone Study  Result Date: 07/05/2016 CLINICAL DATA:  Right flank pain.  Urinary tract infection. EXAM: CT ABDOMEN AND PELVIS WITHOUT CONTRAST TECHNIQUE: Multidetector CT imaging of the abdomen and pelvis was performed following the standard protocol without IV contrast. COMPARISON:  None. FINDINGS: Lower chest: Normal. Hepatobiliary: No focal liver abnormality is seen. No  gallstones, gallbladder wall thickening, or biliary dilatation. Pancreas: Unremarkable. No pancreatic ductal dilatation or surrounding inflammatory changes. Spleen: Normal in size without focal abnormality. Adrenals/Urinary Tract: 2 mm stone in the mid right kidney. 1 mm stone in the upper pole of the left kidney seen only on image 75 of series 5. No hydronephrosis. No ureteral or bladder calculi. Adrenal glands and bladder appear normal. Stomach/Bowel: Stomach is within normal limits. Appendix appears normal. No evidence of bowel wall thickening, distention, or inflammatory changes. Vascular/Lymphatic: There several slightly prominent hazy periaortic lymph nodes in the mid abdomen, nonspecific. The vessels appear normal. Reproductive: Uterus and bilateral adnexa are unremarkable. Other: No abdominal wall hernia or  abnormality. No abdominopelvic ascites. Musculoskeletal: No acute or significant osseous findings. IMPRESSION: Slight nonspecific hazy periaortic adenopathy. Tiny bilateral renal calculi.  No hydronephrosis. Electronically Signed   By: Lorriane Shire M.D.   On: 07/05/2016 10:15    Procedures Procedures (including critical care time)  Medications Ordered in ED Medications  HYDROmorphone (DILAUDID) injection 1 mg (0 mg Intravenous Hold 07/05/16 1123)  sodium chloride 0.9 % bolus 1,000 mL (0 mLs Intravenous Stopped 07/05/16 1123)  ondansetron (ZOFRAN) injection 4 mg (4 mg Intravenous Given 07/05/16 0948)  morphine 4 MG/ML injection 4 mg (4 mg Intravenous Given 07/05/16 0948)  ketorolac (TORADOL) 30 MG/ML injection 30 mg (30 mg Intravenous Given 07/05/16 1109)     Initial Impression / Assessment and Plan / ED Course  I have reviewed the triage vital signs and the nursing notes.  Pertinent labs & imaging results that were available during my care of the patient were reviewed by me and considered in my medical decision making (see chart for details).     Sarah Cohen is a 26 y.o. female  here with R flank pain, ab pain, fever. Already on cipro. Concerned for possible failure of cipro with pyelo vs infected stone ( hx of R hydro). Will repeat UA, labs. Will get CT renal stone.   12:56 PM WBC nl, LFTs remained normal. UA showed small bacteria and squamous and nl WBC and no leuk or nitrates. CT showed some lymphadenopathy around the aorta. Also showed small renal stones but no hydro. Not sure why she has persistent RUQ pain. Mono spot ordered and is negative. Can dc cipro for now. Will give vicodin for pain. Will refer to GI for follow up.   Final Clinical Impressions(s) / ED Diagnoses   Final diagnoses:  None    New Prescriptions New Prescriptions   No medications on file     Drenda Freeze, MD 07/05/16 1258

## 2016-07-05 NOTE — ED Notes (Signed)
Pt transported to CT ?

## 2016-07-06 LAB — URINE CULTURE: CULTURE: NO GROWTH

## 2016-07-10 ENCOUNTER — Other Ambulatory Visit: Payer: Self-pay | Admitting: Student

## 2016-07-10 DIAGNOSIS — R1013 Epigastric pain: Secondary | ICD-10-CM

## 2016-07-10 DIAGNOSIS — R1011 Right upper quadrant pain: Secondary | ICD-10-CM

## 2016-07-29 ENCOUNTER — Encounter
Admission: RE | Admit: 2016-07-29 | Discharge: 2016-07-29 | Disposition: A | Discharge status: Home / Self Care | Payer: BC Managed Care – PPO | Source: Ambulatory Visit | Attending: Student | Admitting: Student

## 2016-07-29 DIAGNOSIS — R1013 Epigastric pain: Secondary | ICD-10-CM | POA: Diagnosis not present

## 2016-07-29 DIAGNOSIS — R1011 Right upper quadrant pain: Secondary | ICD-10-CM

## 2016-07-29 MED ORDER — TECHNETIUM TC 99M MEBROFENIN IV KIT
4.9150 | PACK | Freq: Once | INTRAVENOUS | Status: AC | PRN
  Administered 2016-07-29: 4.915 via INTRAVENOUS

## 2016-07-29 MED ORDER — SINCALIDE 5 MCG IJ SOLR
0.0200 ug/kg | Freq: Once | INTRAMUSCULAR | Status: AC
  Administered 2016-07-29: 1.4 ug via INTRAVENOUS
  Filled 2016-07-29: qty 5

## 2016-09-10 ENCOUNTER — Telehealth: Payer: Self-pay

## 2016-09-10 DIAGNOSIS — Z308 Encounter for other contraceptive management: Secondary | ICD-10-CM

## 2016-09-10 MED ORDER — NORELGESTROMIN-ETH ESTRADIOL 150-35 MCG/24HR TD PTWK: 1.0000 | MEDICATED_PATCH | TRANSDERMAL | 1 refills | Status: DC

## 2016-09-10 NOTE — Telephone Encounter (Signed)
Pt calling requesting refill of xulane to last until her appt 11/04/16.  I eRx'd refill to [facility].  When informed pt she stated she needed to start a patch tomorrow and her local pharm is [facility].  I called and left detailed msg for [facility] to dispense 1patch if they could, if not, one box - either way was fine.

## 2016-10-17 HISTORY — PX: ESOPHAGOGASTRODUODENOSCOPY ENDOSCOPY: SHX5814

## 2016-11-04 ENCOUNTER — Ambulatory Visit: Payer: Self-pay | Admitting: Advanced Practice Midwife

## 2016-11-07 ENCOUNTER — Encounter: Payer: Self-pay | Admitting: Advanced Practice Midwife

## 2016-11-07 ENCOUNTER — Ambulatory Visit (INDEPENDENT_AMBULATORY_CARE_PROVIDER_SITE_OTHER): Payer: BC Managed Care – PPO | Admitting: Advanced Practice Midwife

## 2016-11-07 VITALS — BP 98/60 | HR 72 | Ht 63.5 in | Wt 153.0 lb

## 2016-11-07 DIAGNOSIS — Z113 Encounter for screening for infections with a predominantly sexual mode of transmission: Secondary | ICD-10-CM

## 2016-11-07 DIAGNOSIS — Z124 Encounter for screening for malignant neoplasm of cervix: Secondary | ICD-10-CM | POA: Diagnosis not present

## 2016-11-07 DIAGNOSIS — Z01419 Encounter for gynecological examination (general) (routine) without abnormal findings: Secondary | ICD-10-CM

## 2016-11-07 DIAGNOSIS — Z308 Encounter for other contraceptive management: Secondary | ICD-10-CM | POA: Diagnosis not present

## 2016-11-07 MED ORDER — NORELGESTROMIN-ETH ESTRADIOL 150-35 MCG/24HR TD PTWK: 1.0000 | MEDICATED_PATCH | TRANSDERMAL | 10 refills | Status: DC
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
## 2016-11-08 ENCOUNTER — Telehealth: Payer: Self-pay

## 2016-11-08 NOTE — Telephone Encounter (Signed)
Belenda Cruise from [employer] calling for clarification on ortho evra patch instructions.  Please call [phone].  Ref# [id number]

## 2016-11-08 NOTE — Telephone Encounter (Signed)
I spoke to [facility], the representative and verified the prescription with [facility] pharmacist at [place]

## 2016-11-16 LAB — PAP IG, CT-NG, RFX HPV ALL
Chlamydia, Nuc. Acid Amp: NEGATIVE
Gonococcus by Nucleic Acid Amp: NEGATIVE
PAP SMEAR COMMENT: 0

## 2016-11-16 LAB — HPV DNA PROBE HIGH RISK, AMPLIFIED: HPV, high-risk: POSITIVE — AB

## 2016-11-21 ENCOUNTER — Telehealth: Payer: Self-pay | Admitting: Advanced Practice Midwife

## 2016-11-21 NOTE — Telephone Encounter (Signed)
Pt is calling to speak with Herprovider about her labs. Please call Pt

## 2016-11-21 NOTE — Telephone Encounter (Signed)
Patient received a message from MyChart that her lab results were abnormal.  She has not received a call from our office stating any lab results.  Please call patient today with those lab results.

## 2016-11-22 NOTE — Telephone Encounter (Signed)
Unable to call patient due to phone lines being down. Can you call her to say she will need a repeat PAP in 1 year per guidelines for 26 year old with ASCUS/HPV+. She was negative for strains 16,18, 45 (high risk).

## 2016-11-25 NOTE — Telephone Encounter (Signed)
Please let me or Opal Sidles know when pt calls back

## 2017-11-23 ENCOUNTER — Other Ambulatory Visit: Payer: Self-pay | Admitting: Advanced Practice Midwife

## 2017-11-23 DIAGNOSIS — Z308 Encounter for other contraceptive management: Secondary | ICD-10-CM

## 2017-12-16 ENCOUNTER — Ambulatory Visit (INDEPENDENT_AMBULATORY_CARE_PROVIDER_SITE_OTHER): Payer: BC Managed Care – PPO | Admitting: Advanced Practice Midwife

## 2017-12-16 ENCOUNTER — Encounter: Payer: Self-pay | Admitting: Advanced Practice Midwife

## 2017-12-16 ENCOUNTER — Other Ambulatory Visit (HOSPITAL_COMMUNITY)
Admission: RE | Admit: 2017-12-16 | Discharge: 2017-12-16 | Disposition: A | Discharge status: Home / Self Care | Payer: BC Managed Care – PPO | Source: Ambulatory Visit | Attending: Advanced Practice Midwife | Admitting: Advanced Practice Midwife

## 2017-12-16 VITALS — BP 128/70 | HR 72 | Ht 63.5 in

## 2017-12-16 DIAGNOSIS — Z3045 Encounter for surveillance of transdermal patch hormonal contraceptive device: Secondary | ICD-10-CM

## 2017-12-16 DIAGNOSIS — Z124 Encounter for screening for malignant neoplasm of cervix: Secondary | ICD-10-CM

## 2017-12-16 DIAGNOSIS — Z01419 Encounter for gynecological examination (general) (routine) without abnormal findings: Secondary | ICD-10-CM | POA: Insufficient documentation

## 2017-12-16 MED ORDER — NORELGESTROMIN-ETH ESTRADIOL 150-35 MCG/24HR TD PTWK
1.0000 | MEDICATED_PATCH | TRANSDERMAL | 11 refills | Status: DC
Start: 2017-12-16 — End: 2018-11-30

## 2017-12-16 NOTE — Progress Notes (Signed)
Patient ID: Sarah Cohen, female   DOB: 1990-08-23, 27 y.o.   MRN: [id number]     Gynecology Annual Exam  PCP: Kirk Ruths, MD  Chief Complaint:  Chief Complaint  Patient presents with  . Gynecologic Exam    Declined weight    History of Present Illness: Patient is a 27 y.o. G0P0000 presents for annual exam. The patient has concerns today about follow up of her visit last year. She was concerned about the results of her PAP smear and did not feel that her questions were answered. Reassurance given regarding the results and the guidelines that we follow for all PAP smear results. She understands that follow up this year will depend on the results of her PAP smear today.   LMP: Patient's last menstrual period was 12/16/2017. Menarche:not applicable Average Interval: regular, 28 days Duration of flow: 7 days Heavy Menses: no Clots: no Intermenstrual Bleeding: no Postcoital Bleeding: no Dysmenorrhea: no  The patient is sexually active. She currently uses Xulane patches weekly for contraception. She denies dyspareunia.  The patient does perform self breast exams.  There is no notable family history of breast or ovarian cancer in her family.  The patient wears seatbelts: yes.  The patient has regular exercise: yes.  She admits healthy diet and adequate intake of h2o  The patient denies current symptoms of depression.  She has some mild depression and is able to cope well.   Review of Systems: Review of Systems  Constitutional: Negative.   HENT: Negative.   Eyes: Negative.   Respiratory: Negative.   Cardiovascular: Negative.   Gastrointestinal: Negative.   Genitourinary: Negative.   Musculoskeletal: Negative.   Skin: Negative.   Neurological: Negative.   Endo/Heme/Allergies: Negative.   Psychiatric/Behavioral: Negative.     Past Medical History:  Past Medical History:  Diagnosis Date  . Anemia   . Asthma    childhood asthma  . Cancer ([place])    melanoma    . Chronic kidney disease    UTI  . Headache    migraines...propanolol helps or oils  . History of Raynaud's syndrome   . PONV (postoperative nausea and vomiting)   . POTS (postural orthostatic tachycardia syndrome)     Past Surgical History:  Past Surgical History:  Procedure Laterality Date  . AV NODE ABLATION  2009  . ESOPHAGOGASTRODUODENOSCOPY ENDOSCOPY  10/17/2016  . HERNIA REPAIR  6301   Umbilical  . KNEE ARTHROSCOPY WITH MENISCAL REPAIR Right 06/08/2015   Procedure: KNEE ARTHROSCOPY WITH MEDIAL  MENISCAL REPAIR;  Surgeon: Corky Mull, MD;  Location: [facility] ORS;  Service: Orthopedics;  Laterality: Right;    Gynecologic History:  Patient's last menstrual period was 12/16/2017. Contraception: Xulane patch Last Pap: 1 year ago Results were:  ASCUS with POSITIVE high risk HPV   Obstetric History: G0P0000  Family History:  Family History  Problem Relation Age of Onset  . Thyroid cancer Mother   . Breast cancer Maternal Grandmother 70    Social History:  Social History   Socioeconomic History  . Marital status: Single    Spouse name: Not on file  . Number of children: Not on file  . Years of education: Not on file  . Highest education level: Not on file  Occupational History  . Not on file  Social Needs  . Financial resource strain: Not on file  . Food insecurity:    Worry: Not on file    Inability: Not on file  . Transportation needs:  Medical: Not on file    Non-medical: Not on file  Tobacco Use  . Smoking status: Never Smoker  . Smokeless tobacco: Never Used  Substance and Sexual Activity  . Alcohol use: Yes    Comment: social  . Drug use: No  . Sexual activity: Yes    Birth control/protection: Patch  Lifestyle  . Physical activity:    Days per week: 5 days    Minutes per session: 40 min  . Stress: Not at all  Relationships  . Social connections:    Talks on phone: Not on file    Gets together: Not on file    Attends religious service: Not  on file    Active member of club or organization: Not on file    Attends meetings of clubs or organizations: Not on file    Relationship status: Not on file  . Intimate partner violence:    Fear of current or ex partner: Not on file    Emotionally abused: Not on file    Physically abused: Not on file    Forced sexual activity: Not on file  Other Topics Concern  . Not on file  Social History Narrative  . Not on file    Allergies:  Allergies  Allergen Reactions  . Cephalosporins Anaphylaxis  . Ciprofloxacin Anaphylaxis and Rash  . Ivp Dye [Iodinated Diagnostic Agents] Anaphylaxis    Medications: Prior to Admission medications   Medication Sig Start Date End Date Taking? Authorizing Provider  Cholecalciferol (VITAMIN D3) 5000 UNITS CAPS Take 5,000 Units by mouth daily.   Yes [provider]  Cranberry 500 MG CAPS Take 1 capsule by mouth daily.   Yes [provider]  Cyanocobalamin (VITAMIN B-12) 5000 MCG SUBL Place 1 tablet under the tongue daily.   Yes [provider]  norelgestromin-ethinyl estradiol Marilu Favre) 150-35 MCG/24HR transdermal patch Place onto the skin. 10/21/16  Yes [provider]  ondansetron (ZOFRAN) 4 MG tablet Take 1 tablet (4 mg total) by mouth every 8 (eight) hours as needed for nausea or vomiting. 07/05/16  Yes Drenda Freeze, MD  potassium chloride (K-DUR,KLOR-CON) 10 MEQ tablet Take 10 mEq by mouth every 30 (thirty) days.   Yes [provider]  PROPRANOLOL HCL ER PO Take 60 mg by mouth daily.   Yes [provider]  pseudoephedrine (SUDOGEST) 30 MG tablet Take 30 mg by mouth 3 (three) times daily.   Yes [provider]    Physical Exam Vitals: Blood pressure 128/70, pulse 72, height 5' 3.5" (1.613 m), last menstrual period 12/16/2017.  General: NAD HEENT: normocephalic, anicteric Thyroid: no enlargement, no palpable nodules Pulmonary: No increased work of breathing, CTAB Cardiovascular: RRR,  distal pulses 2+ Breast: Breast symmetrical, no tenderness, no palpable nodules or masses, no skin or nipple retraction present, no nipple discharge.  No axillary or supraclavicular lymphadenopathy. Abdomen: NABS, soft, non-tender, non-distended.  Umbilicus without lesions.  No hepatomegaly, splenomegaly or masses palpable. No evidence of hernia  Genitourinary:  External: Normal external female genitalia.  Normal urethral meatus, normal Bartholin's and Skene's glands.    Vagina: Normal vaginal mucosa, no evidence of prolapse.    Cervix: Grossly normal in appearance, no bleeding, no CMT  Uterus: deferred for no concerns/shared decision making  Adnexa: deferred for no concerns/shared decision making  Rectal: deferred  Lymphatic: no evidence of inguinal lymphadenopathy Extremities: no edema, erythema, or tenderness Neurologic: Grossly intact Psychiatric: mood appropriate, affect full   Assessment: 27 y.o. G0P0000 routine annual exam  Plan: Problem List Items Addressed This Visit    None    Visit Diagnoses    Well woman exam with routine gynecological exam    -  Primary   Relevant Orders   Cytology - PAP   Cervical cancer screening       Relevant Orders   Cytology - PAP      1) 4) Gardasil Series discussed and if applicable offered to patient - Patient has previously completed 3 shot series   2) STI screening  wasoffered and declined  3)  ASCCP guidelines and rational discussed.  Patient opts for yearly screening interval  4) Contraception - the patient is currently using  Xulane patch.  She is happy with her current form of contraception and plans to continue We discussed safe sex practices to reduce her furture risk of STI's.    5) Return in 1 year (on 12/17/2018) for annual established gyn.   Rod Can, [facility], [facility] 12/16/2017, 2:45 PM

## 2017-12-16 NOTE — Patient Instructions (Addendum)
Health Maintenance, Female Adopting a healthy lifestyle and getting preventive care can go a long way to promote health and wellness. Talk with your health care provider about what schedule of regular examinations is right for you. This is a good chance for you to check in with your provider about disease prevention and staying healthy. In between checkups, there are plenty of things you can do on your own. Experts have done a lot of research about which lifestyle changes and preventive measures are most likely to keep you healthy. Ask your health care provider for more information. Weight and diet Eat a healthy diet  Be sure to include plenty of vegetables, fruits, low-fat dairy products, and lean protein.  Do not eat a lot of foods high in solid fats, added sugars, or salt.  Get regular exercise. This is one of the most important things you can do for your health. ? Most adults should exercise for at least 150 minutes each week. The exercise should increase your heart rate and make you sweat (moderate-intensity exercise). ? Most adults should also do strengthening exercises at least twice a week. This is in addition to the moderate-intensity exercise.  Maintain a healthy weight  Body mass index (BMI) is a measurement that can be used to identify possible weight problems. It estimates body fat based on height and weight. Your health care provider can help determine your BMI and help you achieve or maintain a healthy weight.  For females 20 years of age and older: ? A BMI below 18.5 is considered underweight. ? A BMI of 18.5 to 24.9 is normal. ? A BMI of 25 to 29.9 is considered overweight. ? A BMI of 30 and above is considered obese.  Watch levels of cholesterol and blood lipids  You should start having your blood tested for lipids and cholesterol at 26 years of age, then have this test every 5 years.  You may need to have your cholesterol levels checked more often if: ? Your lipid or  cholesterol levels are high. ? You are older than 26 years of age. ? You are at high risk for heart disease.  Cancer screening Lung Cancer  Lung cancer screening is recommended for adults 55-80 years old who are at high risk for lung cancer because of a history of smoking.  A yearly low-dose CT scan of the lungs is recommended for people who: ? Currently smoke. ? Have quit within the past 15 years. ? Have at least a 30-pack-year history of smoking. A pack year is smoking an average of one pack of cigarettes a day for 1 year.  Yearly screening should continue until it has been 15 years since you quit.  Yearly screening should stop if you develop a health problem that would prevent you from having lung cancer treatment.  Breast Cancer  Practice breast self-awareness. This means understanding how your breasts normally appear and feel.  It also means doing regular breast self-exams. Let your health care provider know about any changes, no matter how small.  If you are in your 20s or 30s, you should have a clinical breast exam (CBE) by a health care provider every 1-3 years as part of a regular health exam.  If you are 40 or older, have a CBE every year. Also consider having a breast X-ray (mammogram) every year.  If you have a family history of breast cancer, talk to your health care provider about genetic screening.  If you are at high risk   for breast cancer, talk to your health care provider about having an MRI and a mammogram every year.  Breast cancer gene (BRCA) assessment is recommended for women who have family members with BRCA-related cancers. BRCA-related cancers include: ? Breast. ? Ovarian. ? Tubal. ? Peritoneal cancers.  Results of the assessment will determine the need for genetic counseling and BRCA1 and BRCA2 testing.  Cervical Cancer Your health care provider may recommend that you be screened regularly for cancer of the pelvic organs (ovaries, uterus, and  vagina). This screening involves a pelvic examination, including checking for microscopic changes to the surface of your cervix (Pap test). You may be encouraged to have this screening done every 3 years, beginning at age 22.  For women ages 56-65, health care providers may recommend pelvic exams and Pap testing every 3 years, or they may recommend the Pap and pelvic exam, combined with testing for human papilloma virus (HPV), every 5 years. Some types of HPV increase your risk of cervical cancer. Testing for HPV may also be done on women of any age with unclear Pap test results.  Other health care providers may not recommend any screening for nonpregnant women who are considered low risk for pelvic cancer and who do not have symptoms. Ask your health care provider if a screening pelvic exam is right for you.  If you have had past treatment for cervical cancer or a condition that could lead to cancer, you need Pap tests and screening for cancer for at least 20 years after your treatment. If Pap tests have been discontinued, your risk factors (such as having a new sexual partner) need to be reassessed to determine if screening should resume. Some women have medical problems that increase the chance of getting cervical cancer. In these cases, your health care provider may recommend more frequent screening and Pap tests.  Colorectal Cancer  This type of cancer can be detected and often prevented.  Routine colorectal cancer screening usually begins at 26 years of age and continues through 26 years of age.  Your health care provider may recommend screening at an earlier age if you have risk factors for colon cancer.  Your health care provider may also recommend using home test kits to check for hidden blood in the stool.  A small camera at the end of a tube can be used to examine your colon directly (sigmoidoscopy or colonoscopy). This is done to check for the earliest forms of colorectal  cancer.  Routine screening usually begins at age 33.  Direct examination of the colon should be repeated every 5-10 years through 26 years of age. However, you may need to be screened more often if early forms of precancerous polyps or small growths are found.  Skin Cancer  Check your skin from head to toe regularly.  Tell your health care provider about any new moles or changes in moles, especially if there is a change in a mole's shape or color.  Also tell your health care provider if you have a mole that is larger than the size of a pencil eraser.  Always use sunscreen. Apply sunscreen liberally and repeatedly throughout the day.  Protect yourself by wearing long sleeves, pants, a wide-brimmed hat, and sunglasses whenever you are outside.  Heart disease, diabetes, and high blood pressure  High blood pressure causes heart disease and increases the risk of stroke. High blood pressure is more likely to develop in: ? People who have blood pressure in the high end of  the normal range (130-139/85-89 mm Hg). ? People who are overweight or obese. ? People who are African American.  If you are 21-29 years of age, have your blood pressure checked every 3-5 years. If you are 3 years of age or older, have your blood pressure checked every year. You should have your blood pressure measured twice-once when you are at a hospital or clinic, and once when you are not at a hospital or clinic. Record the average of the two measurements. To check your blood pressure when you are not at a hospital or clinic, you can use: ? An automated blood pressure machine at a pharmacy. ? A home blood pressure monitor.  If you are between 17 years and 37 years old, ask your health care provider if you should take aspirin to prevent strokes.  Have regular diabetes screenings. This involves taking a blood sample to check your fasting blood sugar level. ? If you are at a normal weight and have a low risk for diabetes,  have this test once every three years after 26 years of age. ? If you are overweight and have a high risk for diabetes, consider being tested at a younger age or more often. Preventing infection Hepatitis B  If you have a higher risk for hepatitis B, you should be screened for this virus. You are considered at high risk for hepatitis B if: ? You were born in a country where hepatitis B is common. Ask your health care provider which countries are considered high risk. ? Your parents were born in a high-risk country, and you have not been immunized against hepatitis B (hepatitis B vaccine). ? You have HIV or AIDS. ? You use needles to inject street drugs. ? You live with someone who has hepatitis B. ? You have had sex with someone who has hepatitis B. ? You get hemodialysis treatment. ? You take certain medicines for conditions, including cancer, organ transplantation, and autoimmune conditions.  Hepatitis C  Blood testing is recommended for: ? Everyone born from [birth year] through 1964. ? Anyone with known risk factors for hepatitis C.  Sexually transmitted infections (STIs)  You should be screened for sexually transmitted infections (STIs) including gonorrhea and chlamydia if: ? You are sexually active and are younger than 26 years of age. ? You are older than 26 years of age and your health care provider tells you that you are at risk for this type of infection. ? Your sexual activity has changed since you were last screened and you are at an increased risk for chlamydia or gonorrhea. Ask your health care provider if you are at risk.  If you do not have HIV, but are at risk, it may be recommended that you take a prescription medicine daily to prevent HIV infection. This is called pre-exposure prophylaxis (PrEP). You are considered at risk if: ? You are sexually active and do not regularly use condoms or know the HIV status of your partner(s). ? You take drugs by injection. ? You are  sexually active with a partner who has HIV.  Talk with your health care provider about whether you are at high risk of being infected with HIV. If you choose to begin PrEP, you should first be tested for HIV. You should then be tested every 3 months for as long as you are taking PrEP. Pregnancy  If you are premenopausal and you may become pregnant, ask your health care provider about preconception counseling.  If you may become  pregnant, take 400 to 800 micrograms (mcg) of folic acid every day.  If you want to prevent pregnancy, talk to your health care provider about birth control (contraception). Osteoporosis and menopause  Osteoporosis is a disease in which the bones lose minerals and strength with aging. This can result in serious bone fractures. Your risk for osteoporosis can be identified using a bone density scan.  If you are 69 years of age or older, or if you are at risk for osteoporosis and fractures, ask your health care provider if you should be screened.  Ask your health care provider whether you should take a calcium or vitamin D supplement to lower your risk for osteoporosis.  Menopause may have certain physical symptoms and risks.  Hormone replacement therapy may reduce some of these symptoms and risks. Talk to your health care provider about whether hormone replacement therapy is right for you. Follow these instructions at home:  Schedule regular health, dental, and eye exams.  Stay current with your immunizations.  Do not use any tobacco products including cigarettes, chewing tobacco, or electronic cigarettes.  If you are pregnant, do not drink alcohol.  If you are breastfeeding, limit how much and how often you drink alcohol.  Limit alcohol intake to no more than 1 drink per day for nonpregnant women. One drink equals 12 ounces of beer, 5 ounces of wine, or 1 ounces of hard liquor.  Do not use street drugs.  Do not share needles.  Ask your health care  provider for help if you need support or information about quitting drugs.  Tell your health care provider if you often feel depressed.  Tell your health care provider if you have ever been abused or do not feel safe at home. This information is not intended to replace advice given to you by your health care provider. Make sure you discuss any questions you have with your health care provider. Document Released: 12/17/2010 Document Revised: 11/09/2015 Document Reviewed: 03/07/2015 Elsevier Interactive Patient Education  2018 [place].  Human Papillomavirus Human papillomavirus (HPV) is the most common sexually transmitted infection (STI). It easily spreads from person to person (is highly contagious). HPV infections cause genital warts. Certain types of HPV may cause cancers, including cancer of the lower part of the uterus (cervix), vagina, outer female genital area (vulva), penis, anus, and rectum. HPV may also cause cancers of the oral cavity, such as the throat, tongue, and tonsils. There are many types of HPV. It usually does not cause symptoms. However, sometimes there are wart-like lesions in the throat or warts in the genital area that you can see or feel. It is possible to be infected for long periods and pass HPV to others without knowing it. What are the causes? HPV is caused by a virus that spreads from person to person through sexual contact. This includes oral, vaginal, or anal sex. What increases the risk? The following factors may make you more likely to develop this condition:  Having unprotected oral, vaginal, or anal sex.  Having several sex partners.  Having a sex partner who has other sex partners.  Having or having had another STI.  Having a weak disease-fighting (immune) system.  Having damaged skin in the genital area.  What are the signs or symptoms? Most people who have HPV do not have any symptoms. If symptoms are present, they may include:  Wartlike  lesions in the throat (from having oral sex).  Warts on the infected skin or mucous membranes.  Genital warts that may itch, burn, bleed, or be painful during sexual intercourse.  How is this diagnosed? If wartlike lesions are present in the throat or if genital warts are present, your health care provider can usually diagnose HPV with a physical exam. Genital warts are easily seen. In females, tests may be used to diagnose HPV, including:  A Pap test. A Pap test takes a sample of cells from your cervix to check for cancer and HPV infection.  An HPV test. This is similar to a Pap test and involves taking a sample of cells from your cervix.  Using a scope to view the cervix (colposcopy). This may be done if a pelvic exam or Pap test is abnormal. A sample of tissue may be removed for testing (biopsy) during the colposcopy.  Currently, there is no test to detect HPV in males. How is this treated? There is no treatment for the virus itself. However, there are treatments for the health problems and symptoms HPV can cause. Your health care provider will monitor you closely after you are treated as HPV can come back and may need treatment again. Treatment for HPV may include:  Medicines, which may be injected or applied to genital warts in a cream, lotion, liquid or gel form.  Use of a probe to apply extreme cold (cryotherapy) to the genital warts.  Application of an intense beam of light (laser treatment) on the genital warts.  Use of a probe to apply extreme heat (electrocautery) on the genital warts.  Surgery to remove the genital warts.  Follow these instructions at home: Medicines  Take over-the-counter and prescription medicines only as told by your health care provider. This include creams for itching or irritation.  Do not treat genital warts with medicines used for treating hand warts. General instructions  Do not touch or scratch the warts.  Do not have sex while you are  being treated.  Do not douche or use tampons during treatment (women).  Tell your sex partner about your infection. He or she may also need to be treated.  If you become pregnant, tell your health care provider that you have HPV. Your health care provider will monitor you closely during pregnancy to make sure your baby is safe.  Keep all follow-up visits as told by your health care provider. This is important. How is this prevented?  Talk with your health care provider about getting the HPV vaccines. These vaccines prevent some HPV infections and cancers. The vaccines are recommended for males and females between the ages of 24 and 24. They will not work if you already have HPV, and they are not recommended for pregnant women.  After treatment, use condoms during sex to prevent future infections.  Have only one sex partner.  Have a sex partner who does not have other sex partners.  Get regular Pap tests as directed by your health care provider. Contact a health care provider if:  The treated skin becomes red, swollen, or painful.  You have a fever.  You feel generally ill.  You feel lumps or pimples sticking out in and around your genital area.  You develop bleeding of the vagina or the treatment area.  You have painful sexual intercourse. Summary  Human papillomavirus (HPV) is the most common sexually transmitted infection (STI) and is highly contagious.  Most people carrying HPV do not have any symptoms.  HPV can be prevented with vaccination. The vaccine is recommended for males and females between the  ages of 1 and 49.  There is no treatment for the virus itself. However, there are treatments for the health problems and symptoms HPV can cause. This information is not intended to replace advice given to you by your health care provider. Make sure you discuss any questions you have with your health care provider. Document Released: 08/24/2003 Document Revised: 05/12/2016  Document Reviewed: 05/12/2016 Elsevier Interactive Patient Education  [employer].

## 2017-12-23 LAB — CYTOLOGY - PAP
DIAGNOSIS: NEGATIVE
HPV 16/18/45 GENOTYPING: NEGATIVE
HPV: DETECTED

## 2018-11-17 IMAGING — NM NM HEPATO W/GB/PHARM/[PERSON_NAME]
2 series · 12 of 12 positions shown · non-contrast
Comparison: Abdominal and pelvic CT scan July 05, 2016 and
abdominal ultrasound June 28, 2016.

CLINICAL DATA: Right upper quadrant pain intermittently for the
past 2 months. Postprandial nausea.

EXAM:
NUCLEAR MEDICINE HEPATOBILIARY IMAGING WITH GALLBLADDER EF
TECHNIQUE: Sequential images of the abdomen were obtained [DATE] minutes
following intravenous administration of radiopharmaceutical. After
slow intravenous infusion of 1.4 micrograms Cholecystokinin,
gallbladder ejection fraction was determined.
RADIOPHARMACEUTICALS:  4.91 mCi Wc-DDm Choletec IV

[Series 1000: hepatobiliary scan · 9.59mm/px · 6 of 60 frames shown]
[frame 6/60]
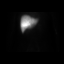
[frame 16/60]
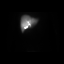
[frame 26/60]
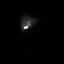
[frame 36/60]
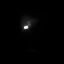
[frame 46/60]
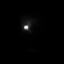
[frame 56/60]
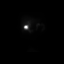

[Series 1000: gallbladder ef · 4.80mm/px · 6 of 120 frames shown]
[frame 11/120]
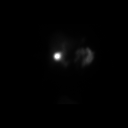
[frame 31/120]
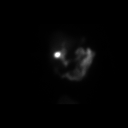
[frame 51/120]
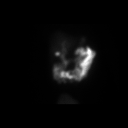
[frame 71/120]
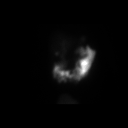
[frame 91/120]
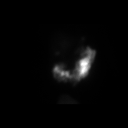
[frame 111/120]
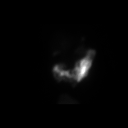

[12 of 12 positions shown; findings below may reference images not displayed]

FINDINGS: Prompt uptake and biliary excretion of activity by the liver is
seen. Gallbladder activity is visualized, consistent with patency of
cystic duct. Biliary activity passes into small bowel, consistent
with patent common bile duct.

Calculated gallbladder ejection fraction is 100%%. (At 60 min,
normal ejection fraction is greater than 40%.)
IMPRESSION: Normal hepatobiliary scan with normal gallbladder ejection fraction.

## 2018-11-29 ENCOUNTER — Other Ambulatory Visit: Payer: Self-pay | Admitting: Advanced Practice Midwife

## 2018-11-29 DIAGNOSIS — Z3045 Encounter for surveillance of transdermal patch hormonal contraceptive device: Secondary | ICD-10-CM

## 2018-11-30 NOTE — Telephone Encounter (Signed)
Annual due in July

## 2019-03-03 ENCOUNTER — Other Ambulatory Visit: Payer: Self-pay | Admitting: Advanced Practice Midwife

## 2019-03-03 DIAGNOSIS — Z3045 Encounter for surveillance of transdermal patch hormonal contraceptive device: Secondary | ICD-10-CM

## 2019-06-06 ENCOUNTER — Other Ambulatory Visit: Payer: Self-pay | Admitting: Advanced Practice Midwife

## 2019-06-06 DIAGNOSIS — Z3045 Encounter for surveillance of transdermal patch hormonal contraceptive device: Secondary | ICD-10-CM

## 2020-03-15 ENCOUNTER — Ambulatory Visit (INDEPENDENT_AMBULATORY_CARE_PROVIDER_SITE_OTHER): Payer: BC Managed Care – PPO | Admitting: Obstetrics

## 2020-03-15 ENCOUNTER — Other Ambulatory Visit: Payer: Self-pay

## 2020-03-15 ENCOUNTER — Encounter: Payer: Self-pay | Admitting: Obstetrics

## 2020-03-15 ENCOUNTER — Other Ambulatory Visit (HOSPITAL_COMMUNITY)
Admission: RE | Admit: 2020-03-15 | Discharge: 2020-03-15 | Disposition: A | Discharge status: Home / Self Care | Payer: BC Managed Care – PPO | Source: Ambulatory Visit | Attending: Obstetrics | Admitting: Obstetrics

## 2020-03-15 VITALS — BP 100/70 | HR 74 | Ht 63.5 in | Wt 200.0 lb

## 2020-03-15 DIAGNOSIS — Z124 Encounter for screening for malignant neoplasm of cervix: Secondary | ICD-10-CM | POA: Diagnosis present

## 2020-03-15 DIAGNOSIS — R635 Abnormal weight gain: Secondary | ICD-10-CM | POA: Insufficient documentation

## 2020-03-15 DIAGNOSIS — I498 Other specified cardiac arrhythmias: Secondary | ICD-10-CM

## 2020-03-15 DIAGNOSIS — G90A Postural orthostatic tachycardia syndrome (POTS): Secondary | ICD-10-CM | POA: Insufficient documentation

## 2020-03-15 DIAGNOSIS — L659 Nonscarring hair loss, unspecified: Secondary | ICD-10-CM

## 2020-03-15 DIAGNOSIS — Z01419 Encounter for gynecological examination (general) (routine) without abnormal findings: Secondary | ICD-10-CM | POA: Insufficient documentation

## 2020-03-15 NOTE — Progress Notes (Addendum)
Gynecology Annual Exam  PCP: Kirk Ruths, MD  Chief Complaint:  Chief Complaint  Patient presents with  . Gynecologic Exam    History of Present Illness:  Ms. Sarah Cohen is a 29 y.o. G0P0000 who LMP was Patient's last menstrual period was 02/22/2020 (exact date)., presents today for her annual examination.  Her menses are regular every 30-32 days, lasting 8 day(s).  Dysmenorrhea none. She does not have intermenstrual bleeding.   In addition, she is concerned about unexplainied weight gain over the past year. She has worked out several times weekly, reduced her caloric intake and still has gained weight. She plans to be referred to an endocrinologist for further evaluation, but would like some additional lab work today as part of her well woman visit. There is a strong hx of thyroid problems in her family (mother) she is concerned about her hair, wight gain and would like evaluation with lab work to explore possible causes.  She is not sexually active. Sarah Cohen is single,not presently sexually active. She does not plan on having children. Last Pap: 2020  Results were: ASCUS with POSITIVE high risk HPV  She has not had a follow up Pap in two years secondary to COVID Hx of STDs: none  There is no FH of breast cancer. There is no FH of ovarian cancer. The patient does do self-breast exams.  Tobacco use: The patient denies current or previous tobacco use. Alcohol use: social drinker Exercise: not active    The patient wears seatbelts: yes.   The patient reports that domestic violence in her life is absent.   Past Medical History:  Diagnosis Date  . Anemia   . Asthma    childhood asthma  . Cancer ([place])    melanoma  . Chronic kidney disease    UTI  . Headache    migraines...propanolol helps or oils  . History of Raynaud's syndrome   . PONV (postoperative nausea and vomiting)   . POTS (postural orthostatic tachycardia syndrome)     Past Surgical History:  Procedure  Laterality Date  . AV NODE ABLATION  2009  . ESOPHAGOGASTRODUODENOSCOPY ENDOSCOPY  10/17/2016  . HERNIA REPAIR  2202   Umbilical  . KNEE ARTHROSCOPY WITH MENISCAL REPAIR Right 06/08/2015   Procedure: KNEE ARTHROSCOPY WITH MEDIAL  MENISCAL REPAIR;  Surgeon: Corky Mull, MD;  Location: [facility] ORS;  Service: Orthopedics;  Laterality: Right;    Prior to Admission medications   Medication Sig Start Date End Date Taking? Authorizing Provider  Cholecalciferol (VITAMIN D3) 5000 UNITS CAPS Take 5,000 Units by mouth daily.   Yes [provider]  Cranberry 500 MG CAPS Take 1 capsule by mouth daily.   Yes [provider]  Cyanocobalamin (VITAMIN B-12) 5000 MCG SUBL Place 1 tablet under the tongue daily.   Yes [provider]  ondansetron (ZOFRAN) 4 MG tablet Take 1 tablet (4 mg total) by mouth every 8 (eight) hours as needed for nausea or vomiting. 07/05/16  Yes Drenda Freeze, MD  potassium chloride (K-DUR,KLOR-CON) 10 MEQ tablet Take 10 mEq by mouth every 30 (thirty) days.   Yes [provider]  PROPRANOLOL HCL ER PO Take 60 mg by mouth daily.   Yes [provider]  pseudoephedrine (SUDOGEST) 30 MG tablet Take 30 mg by mouth 3 (three) times daily.   Yes [provider]    Allergies  Allergen Reactions  . Cephalosporins Anaphylaxis  . Ciprofloxacin Anaphylaxis and Rash  . Ivp Dye [Iodinated  Diagnostic Agents] Anaphylaxis  . Prednisone Anaphylaxis    Gynecologic History: Patient's last menstrual period was 02/22/2020 (exact date). History of abnormal pap smear: Yes History of STI: No  ASCUS with High risk HPV in 2020  Obstetric History: G0P0000  Social History   Socioeconomic History  . Marital status: Single    Spouse name: Not on file  . Number of children: Not on file  . Years of education: Not on file  . Highest education level: Not on file  Occupational History  . Not on file  Tobacco Use  . Smoking status: Never Smoker  .  Smokeless tobacco: Never Used  Vaping Use  . Vaping Use: Never used  Substance and Sexual Activity  . Alcohol use: Yes    Comment: social  . Drug use: No  . Sexual activity: Yes    Birth control/protection: Condom  Other Topics Concern  . Not on file  Social History Narrative  . Not on file   Social Determinants of Health   Financial Resource Strain:   . Difficulty of Paying Living Expenses: Not on file  Food Insecurity:   . Worried About Charity fundraiser in the Last Year: Not on file  . Ran Out of Food in the Last Year: Not on file  Transportation Needs:   . Lack of Transportation (Medical): Not on file  . Lack of Transportation (Non-Medical): Not on file  Physical Activity:   . Days of Exercise per Week: Not on file  . Minutes of Exercise per Session: Not on file  Stress:   . Feeling of Stress : Not on file  Social Connections:   . Frequency of Communication with Friends and Family: Not on file  . Frequency of Social Gatherings with Friends and Family: Not on file  . Attends Religious Services: Not on file  . Active Member of Clubs or Organizations: Not on file  . Attends Archivist Meetings: Not on file  . Marital Status: Not on file  Intimate Partner Violence:   . Fear of Current or Ex-Partner: Not on file  . Emotionally Abused: Not on file  . Physically Abused: Not on file  . Sexually Abused: Not on file    Family History  Problem Relation Age of Onset  . Thyroid cancer Mother   . Breast cancer Maternal Grandmother 70    Review of Systems  Constitutional: Negative.   HENT: Negative.   Eyes: Negative.   Respiratory: Negative.   Cardiovascular: Negative.        Hx of POTS Cardiac ablation Followed by a cardiologist   Gastrointestinal: Negative.   Genitourinary: Negative.   Musculoskeletal: Negative.   Skin: Negative.   Neurological: Negative.   Endo/Heme/Allergies: Negative.   Psychiatric/Behavioral: Negative.      Physical Exam BP  100/70   Pulse 74   Ht 5' 3.5" (1.613 m)   Wt 200 lb (90.7 kg)   LMP 02/22/2020 (Exact Date)   BMI 34.87 kg/m    Physical Exam Constitutional:      Appearance: Normal appearance. She is obese.  Genitourinary:     Vulva normal.     Genitourinary Comments: Uterus is anteverted, non enlarged  HENT:     Head: Normocephalic and atraumatic.     Mouth/Throat:     Mouth: Mucous membranes are moist.  Eyes:     Extraocular Movements: Extraocular movements intact.     Pupils: Pupils are equal, round, and reactive to light.  Cardiovascular:  Rate and Rhythm: Normal rate and regular rhythm.     Pulses: Normal pulses.     Heart sounds: Normal heart sounds.  Pulmonary:     Effort: Pulmonary effort is normal.     Breath sounds: Normal breath sounds.  Abdominal:     General: Bowel sounds are normal.     Palpations: Abdomen is soft.  Musculoskeletal:        General: Normal range of motion.     Cervical back: Normal range of motion and neck supple.  Neurological:     General: No focal deficit present.     Mental Status: She is alert and oriented to person, place, and time.  Skin:    General: Skin is warm and dry.  Psychiatric:        Mood and Affect: Mood normal.        Behavior: Behavior normal.     Female chaperone present for pelvic and breast  portions of the physical exam  Results: Assessment: 29 y.o. G0P0000 female here for routine annual gynecologic examination  unexplained weight gain concern Hx of ASCUS pap with high risk  Plan: Problem List Items Addressed This Visit      Cardiovascular and Mediastinum   POTS (postural orthostatic tachycardia syndrome)     Other   Well woman exam with routine gynecological exam   Unexplained weight gain    Other Visit Diagnoses    Women's annual routine gynecological examination    -  Primary   Relevant Orders   Cytology - PAP   25-Hydroxy vitamin D Lcms D2+D3   CBC With Differential   Comprehensive metabolic panel    Testosterone,Free and Total   TSH + free T4   Cervical cancer screening       Relevant Orders   Cytology - PAP   CBC With Differential   Comprehensive metabolic panel   Testosterone,Free and Total   TSH + free T4   Weight gain       Relevant Orders   TSH + free T4      Screening: -- Blood pressure screen normal -- Weight screening: concerned about gain, despite regular work outs, following diet. will order additional labs -- Depression screening negative (PHQ-9) -- Nutrition: normal -- cholesterol screening: not due for screening -- osteoporosis screening: not due -- tobacco screening: not using -- alcohol screening: AUDIT questionnaire indicates low-risk usage. -- family history of breast cancer screening: done. not at high risk. -- no evidence of domestic violence or intimate partner violence. -- STD screening: gonorrhea/chlamydia NAAT not collected per patient request. -- pap smear collected per ASCCP guidelines -- flu vaccine deferred -- HPV vaccination series: not eligilbe  Imagene Riches, CNM  03/15/2020 6:00 PM   03/15/2020 5:38 PM

## 2020-03-21 LAB — CYTOLOGY - PAP
Chlamydia: NEGATIVE
Comment: NEGATIVE
Comment: NEGATIVE
Comment: NEGATIVE
Comment: NORMAL
Diagnosis: NEGATIVE
HPV 16: NEGATIVE
HPV 18 / 45: NEGATIVE
High risk HPV: POSITIVE — AB
Neisseria Gonorrhea: NEGATIVE

## 2020-03-22 LAB — CBC WITH DIFFERENTIAL
Basophils Absolute: 0.1 10*3/uL (ref 0.0–0.2)
Basos: 1 %
EOS (ABSOLUTE): 0.1 10*3/uL (ref 0.0–0.4)
Eos: 1 %
Hematocrit: 38.9 % (ref 34.0–46.6)
Hemoglobin: 13.9 g/dL (ref 11.1–15.9)
Immature Grans (Abs): 0 10*3/uL (ref 0.0–0.1)
Immature Granulocytes: 0 %
Lymphocytes Absolute: 3.3 10*3/uL — ABNORMAL HIGH (ref 0.7–3.1)
Lymphs: 50 %
MCH: 32.4 pg (ref 26.6–33.0)
MCHC: 35.7 g/dL (ref 31.5–35.7)
MCV: 91 fL (ref 79–97)
Monocytes Absolute: 0.4 10*3/uL (ref 0.1–0.9)
Monocytes: 6 %
Neutrophils Absolute: 2.8 10*3/uL (ref 1.4–7.0)
Neutrophils: 42 %
RBC: 4.29 x10E6/uL (ref 3.77–5.28)
RDW: 12.4 % (ref 11.7–15.4)
WBC: 6.6 10*3/uL (ref 3.4–10.8)

## 2020-03-22 LAB — TESTOSTERONE,FREE AND TOTAL
Testosterone, Free: 1.6 pg/mL (ref 0.0–4.2)
Testosterone: 14 ng/dL (ref 13–71)

## 2020-03-22 LAB — 25-HYDROXY VITAMIN D LCMS D2+D3
25-Hydroxy, Vitamin D-2: <1 ng/mL
25-Hydroxy, Vitamin D-3: 24 ng/mL
25-Hydroxy, Vitamin D: 25 ng/mL — ABNORMAL LOW

## 2020-03-22 LAB — COMPREHENSIVE METABOLIC PANEL
ALT: 12 IU/L (ref 0–32)
AST: 13 IU/L (ref 0–40)
Albumin/Globulin Ratio: 2.3 — ABNORMAL HIGH (ref 1.2–2.2)
Albumin: 4.6 g/dL (ref 3.9–5.0)
Alkaline Phosphatase: 56 IU/L (ref 44–121)
BUN/Creatinine Ratio: 14 (ref 9–23)
BUN: 10 mg/dL (ref 6–20)
Bilirubin Total: 0.4 mg/dL (ref 0.0–1.2)
CO2: 21 mmol/L (ref 20–29)
Calcium: 9.6 mg/dL (ref 8.7–10.2)
Chloride: 103 mmol/L (ref 96–106)
Creatinine, Ser: 0.74 mg/dL (ref 0.57–1.00)
GFR calc Af Amer: 128 mL/min/{1.73_m2} (ref >59)
GFR calc non Af Amer: 111 mL/min/{1.73_m2} (ref >59)
Globulin, Total: 2 g/dL (ref 1.5–4.5)
Glucose: 87 mg/dL (ref 65–99)
Potassium: 4.4 mmol/L (ref 3.5–5.2)
Sodium: 139 mmol/L (ref 134–144)
Total Protein: 6.6 g/dL (ref 6.0–8.5)

## 2020-03-22 LAB — TSH+FREE T4
Free T4: 1.09 ng/dL (ref 0.82–1.77)
TSH: 0.585 u[IU]/mL (ref 0.450–4.500)

## 2020-03-24 ENCOUNTER — Other Ambulatory Visit: Payer: Self-pay | Admitting: Obstetrics

## 2020-03-24 DIAGNOSIS — N939 Abnormal uterine and vaginal bleeding, unspecified: Secondary | ICD-10-CM

## 2020-03-24 NOTE — Progress Notes (Signed)
Called patient and shared lab results. She will start on a daily Vitamin D capsule. She reports several episodes of vaginal bleeding midcycle or post intercourse and would like to explore this. Pelvic ultrasound ordered for her. She will make an appt with Dr. Gilman Schmidt to discuss and address her concerns.

## 2020-04-10 ENCOUNTER — Ambulatory Visit (INDEPENDENT_AMBULATORY_CARE_PROVIDER_SITE_OTHER): Payer: BC Managed Care – PPO | Admitting: Obstetrics and Gynecology

## 2020-04-10 ENCOUNTER — Ambulatory Visit (INDEPENDENT_AMBULATORY_CARE_PROVIDER_SITE_OTHER): Payer: BC Managed Care – PPO

## 2020-04-10 ENCOUNTER — Other Ambulatory Visit: Payer: Self-pay | Admitting: Obstetrics

## 2020-04-10 ENCOUNTER — Other Ambulatory Visit: Payer: Self-pay

## 2020-04-10 ENCOUNTER — Encounter: Payer: Self-pay | Admitting: Obstetrics and Gynecology

## 2020-04-10 VITALS — BP 108/70 | Ht 62.5 in | Wt 201.2 lb

## 2020-04-10 DIAGNOSIS — N760 Acute vaginitis: Secondary | ICD-10-CM | POA: Diagnosis not present

## 2020-04-10 DIAGNOSIS — N939 Abnormal uterine and vaginal bleeding, unspecified: Secondary | ICD-10-CM

## 2020-04-10 MED ORDER — CLINDAMYCIN PHOSPHATE 1 % EX GEL: Freq: Two times a day (BID) | CUTANEOUS | 11 refills | Status: AC

## 2020-04-10 NOTE — Progress Notes (Signed)
Patient ID: Sarah Cohen, female   DOB: [birth year], 28 y.o.   MRN: [id number]  Reason for Consult: Gynecologic Exam   Referred by Kirk Ruths, MD  Subjective:     HPI:  Sarah Cohen is a 29 y.o. female. She presents for complaints of irregular spotting for the last 3-4 months. She reports she normally has a regular 29-32 day cycle. She recently noticed midcycle spotting. Recently she had spotting and bleeding for a week while on vacation and hiking. She notes spotting after clitoral stimulation/ non-penatrative masturbation. She is sexually active, but last intercourse July. Does not believe she had bleeding after intercourse.   She additionally notes a spot on her right groin where she noted chaffing and pain after hiking.  She additionally notes that she will have lymph node swelling that comes and goes around the time of her period.     Past Medical History:  Diagnosis Date  . Anemia   . Asthma    childhood asthma  . Cancer ([place])    melanoma  . Chronic kidney disease    UTI  . Headache    migraines...propanolol helps or oils  . History of Raynaud's syndrome   . PONV (postoperative nausea and vomiting)   . POTS (postural orthostatic tachycardia syndrome)    Family History  Problem Relation Age of Onset  . Thyroid cancer Mother   . Breast cancer Maternal Grandmother 78   Past Surgical History:  Procedure Laterality Date  . AV NODE ABLATION  2009  . ESOPHAGOGASTRODUODENOSCOPY ENDOSCOPY  10/17/2016  . HERNIA REPAIR  4010   Umbilical  . KNEE ARTHROSCOPY WITH MENISCAL REPAIR Right 06/08/2015   Procedure: KNEE ARTHROSCOPY WITH MEDIAL  MENISCAL REPAIR;  Surgeon: Corky Mull, MD;  Location: [facility] ORS;  Service: Orthopedics;  Laterality: Right;    Short Social History:  Social History   Tobacco Use  . Smoking status: Never Smoker  . Smokeless tobacco: Never Used  Substance Use Topics  . Alcohol use: Yes    Comment: social    Allergies    Allergen Reactions  . Cephalosporins Anaphylaxis  . Ciprofloxacin Anaphylaxis and Rash  . Ivp Dye [Iodinated Diagnostic Agents] Anaphylaxis  . Prednisone Anaphylaxis    Current Outpatient Medications  Medication Sig Dispense Refill  . Cholecalciferol (VITAMIN D3) 5000 UNITS CAPS Take 5,000 Units by mouth daily.    . Cranberry 500 MG CAPS Take 1 capsule by mouth daily.    . Cyanocobalamin (VITAMIN B-12) 5000 MCG SUBL Place 1 tablet under the tongue daily.    . ondansetron (ZOFRAN) 4 MG tablet Take 1 tablet (4 mg total) by mouth every 8 (eight) hours as needed for nausea or vomiting. 10 tablet 0  . potassium chloride (K-DUR,KLOR-CON) 10 MEQ tablet Take 10 mEq by mouth every 30 (thirty) days.    Marland Kitchen PROPRANOLOL HCL ER PO Take 60 mg by mouth daily.    . pseudoephedrine (SUDOGEST) 30 MG tablet Take 30 mg by mouth 3 (three) times daily.     No current facility-administered medications for this visit.    Review of Systems  Constitutional: Negative for chills, fatigue, fever and unexpected weight change.  HENT: Negative for trouble swallowing.  Eyes: Negative for loss of vision.  Respiratory: Negative for cough, shortness of breath and wheezing.  Cardiovascular: Negative for chest pain, leg swelling, palpitations and syncope.  GI: Negative for abdominal pain, blood in stool, diarrhea, nausea and vomiting.  GU: Negative for difficulty urinating, dysuria,  frequency and hematuria.  Musculoskeletal: Negative for back pain, leg pain and joint pain.  Skin: Negative for rash.  Neurological: Negative for dizziness, headaches, light-headedness, numbness and seizures.  Psychiatric: Negative for behavioral problem, confusion, depressed mood and sleep disturbance.        Objective:  Objective   Vitals:   04/10/20 1556  BP: 108/70  Weight: 201 lb 3.2 oz (91.3 kg)  Height: 5' 2.5" (1.588 m)   Body mass index is 36.21 kg/m.  Physical Exam Vitals and nursing note reviewed.  Constitutional:       Appearance: She is well-developed.  HENT:     Head: Normocephalic and atraumatic.  Eyes:     Pupils: Pupils are equal, round, and reactive to light.  Cardiovascular:     Rate and Rhythm: Normal rate and regular rhythm.  Pulmonary:     Effort: Pulmonary effort is normal. No respiratory distress.  Genitourinary:      Comments: External: Normal appearing vulva. Boil in right groin Some clumpy white discharge on outer vulva.   Skin:    General: Skin is warm and dry.  Neurological:     Mental Status: She is alert and oriented to person, place, and time.  Psychiatric:        Behavior: Behavior normal.        Thought Content: Thought content normal.        Judgment: Judgment normal.          Assessment/Plan:     29 yo G0P0000 1. Abnormal uterine bleeding, likely secondary to endometrial polyp. Pelvic US suggestive of endometrial polyp. Discussed options for hysteroscopic removal. Discussed risks of infection, bleeding, and damage to surrounding pelvic organs. Will schedule hysteroscopy D&C with hysteroscopic polypectomy.  2. NUswab for  Acute vagintis. 3. Boil in right groin, rx for topical clindamycin.   More than 25 minutes were spent face to face with the patient in the room, reviewing the medical record, labs and images, and coordinating care for the patient. The plan of management was discussed in detail and counseling was provided.    Adrian Prows MD [facility] 04/10/2020 4:50 PM

## 2020-04-10 NOTE — Patient Instructions (Signed)
Hysteroscopy °Hysteroscopy is a procedure that is used to examine the inside of a woman's womb (uterus). This may be done for various reasons, including: °· To look for lumps (tumors) and other growths in the uterus. °· To evaluate abnormal bleeding, fibroid tumors, polyps, scar tissue (adhesions), or cancer of the uterus. °· To determine the cause of an inability to get pregnant (infertility) or repeated losses of pregnancies (miscarriages). °· To find a lost IUD (intrauterine device). °· To perform a procedure that permanently prevents pregnancy (sterilization). °During this procedure, a thin, flexible tube with a small light and camera (hysteroscope) is used to examine the uterus. The camera sends images to a monitor in the room so that your health care provider can view the inside of your uterus. A hysteroscopy should be done right after a menstrual period to make sure that you are not pregnant. °Tell a health care provider about: °· Any allergies you have. °· All medicines you are taking, including vitamins, herbs, eye drops, creams, and over-the-counter medicines. °· Any problems you or family members have had with the use of anesthetic medicines. °· Any blood disorders you have. °· Any surgeries you have had. °· Any medical conditions you have. °· Whether you are pregnant or may be pregnant. °What are the risks? °Generally, this is a safe procedure. However, problems may occur, including: °· Excessive bleeding. °· Infection. °· Damage to the uterus or other structures or organs. °· Allergic reaction to medicines or fluids that are used in the procedure. °What happens before the procedure? °Staying hydrated °Follow instructions from your health care provider about hydration, which may include: °· Up to 2 hours before the procedure - you may continue to drink clear liquids, such as water, clear fruit juice, black coffee, and plain tea. °Eating and drinking restrictions °Follow instructions from your health care  provider about eating and drinking, which may include: °· 8 hours before the procedure - stop eating solid foods and drink clear liquids only °· 2 hours before the procedure - stop drinking clear liquids. °General instructions °· Ask your health care provider about: °? Changing or stopping your normal medicines. This is important if you take diabetes medicines or blood thinners. °? Taking medicines such as aspirin and ibuprofen. These medicines can thin your blood and cause bleeding. Do not take these medicines for 1 week before your procedure, or as told by your health care provider. °· Do not use any products that contain nicotine or tobacco for 2 weeks before the procedure. This includes cigarettes and e-cigarettes. If you need help quitting, ask your health care provider. °· Medicine may be placed in your cervix the day before the procedure. This medicine causes the cervix to have a larger opening (dilate). The larger opening makes it easier for the hysteroscope to be inserted into the uterus during the procedure. °· Plan to have someone with you for the first 24-48 hours after the procedure, especially if you are given a medicine to make you fall asleep (general anesthetic). °· Plan to have someone take you home from the hospital or clinic. °What happens during the procedure? °· To lower your risk of infection: °? Your health care team will wash or sanitize their hands. °? Your skin will be washed with soap. °? Hair may be removed from the surgical area. °· An IV tube will be inserted into one of your veins. °· You may be given one or more of the following: °? A medicine to help   you relax (sedative). °? A medicine that numbs the area around the cervix (local anesthetic). °? A medicine to make you fall asleep (general anesthetic). °· A hysteroscope will be inserted through your vagina and into your uterus. °· Air or fluid will be used to enlarge your uterus, enabling your health care provider to see your uterus  better. The amount of fluid used will be carefully checked throughout the procedure. °· In some cases, tissue may be gently scraped from inside the uterus and sent to a lab for testing (biopsy). °The procedure may vary among health care providers and hospitals. °What happens after the procedure? °· Your blood pressure, heart rate, breathing rate, and blood oxygen level will be monitored until the medicines you were given have worn off. °· You may have some cramping. You may be given medicines for this. °· You may have bleeding, which varies from light spotting to menstrual-like bleeding. This is normal. °· If you had a biopsy done, it is your responsibility to get the results of your procedure. Ask your health care provider, or the department performing the procedure, when your results will be ready. °Summary °· Hysteroscopy is a procedure that is used to examine the inside of a woman's womb (uterus). °· After the procedure, you may have bleeding, which varies from light spotting to menstrual-like bleeding. This is normal. You may also have cramping. °· Plan to have someone take you home from the hospital or clinic. °This information is not intended to replace advice given to you by your health care provider. Make sure you discuss any questions you have with your health care provider. °Document Revised: 05/16/2017 Document Reviewed: 07/02/2016 °Elsevier Patient Education © 2020 [employer] Inc[employer] ° °

## 2020-04-14 ENCOUNTER — Other Ambulatory Visit: Payer: Self-pay | Admitting: Obstetrics and Gynecology

## 2020-04-14 DIAGNOSIS — N760 Acute vaginitis: Secondary | ICD-10-CM

## 2020-04-14 LAB — NUSWAB BV AND CANDIDA, NAA
Candida albicans, NAA: POSITIVE — AB
Candida glabrata, NAA: NEGATIVE

## 2020-04-14 MED ORDER — FLUCONAZOLE 150 MG PO TABS: 150.0000 mg | ORAL_TABLET | ORAL | 0 refills | Status: AC

## 2020-04-26 ENCOUNTER — Telehealth: Payer: Self-pay

## 2020-04-26 NOTE — Telephone Encounter (Signed)
Spoke w/patient. She states her EOB shows NON COVERED. [facility] advised her it was due to NOT DONE BY PROPER PROVIDER. Her bill was ~$1,000. They only covered $34. Advised will speak to our [facility] to get some insite and get back to her.

## 2020-04-26 NOTE — Telephone Encounter (Signed)
Pt calling; has recv'd a bill from [place] for some tests she had done a few weeks ago; ins refused to pay b/c of how they were coded.  How can this be rectified?  [phone] Per P[place]ett left detailed msg for pt to contact [facility] then [facility] will send [place] a request for further information.

## 2020-04-26 NOTE — Telephone Encounter (Signed)
Patient reports she has already called [facility] who advised her to reach out to [place] to have the codes changed. Cb#[phone]

## 2020-04-27 NOTE — Telephone Encounter (Signed)
Spoke w/Jennifer. She hasn't not received a request for additional information for this patient. Anderson Malta reviewed the labs ordered. Margaret ordered a Specialty Vitamin D lab which is ~$800 and should be ordered by an Endocrinologist. Please advise.

## 2020-05-01 NOTE — Telephone Encounter (Addendum)
Patient is aware the [facility] is working on this. She wanted to be sure that both adnormal weight gain and hair loss diagnosis were used as this was not done as routine. Advised Abnormal weight gain dx is on the lab, but hair loss was not.

## 2020-05-03 NOTE — Telephone Encounter (Signed)
Spoke with both Sarah Cohen and Sarah Cohen,who don't think we can change this order as it was laready run. Don't know why it comes up in my Favorites list.I looked for Vitamin D labwork and checked this order-  Very frustrating for this patient. Thsi is not her error-

## 2020-05-26 NOTE — Telephone Encounter (Signed)
Pt calling; has recv'd 2nd bill from [place] re vitamin D order; pt has been told we were handling this and wants to know if this is still the case - that she is not to pay it?  She doesn't want to be sent to collections as she is trying to buy a house and doesn't want her credit score to reflect this.  [phone]

## 2020-05-30 NOTE — Telephone Encounter (Signed)
LMVM that Administrator is out of the office. Will have her follow up with patient regarding this.

## 2020-06-19 NOTE — Telephone Encounter (Signed)
Patient following up as she doesn't want this to go to collections. Cb#[phone]

## 2020-06-27 NOTE — Telephone Encounter (Signed)
LMVM to notify patient per Administrator everything that can be done has been done on our end. It has been turned over to [facility]. Patient advised to discuss with [facility] to determine how to proceed.

## 2020-07-07 NOTE — Telephone Encounter (Addendum)
Patient spoke w/[facility] this morning as she was advised per our last message to her. [facility] advised her that they have reached out to [place] multiple times & has not had success in changing the coding to fix her bill. They also advised her that no one from our office has reached out to them regarding her account. She spoke to them (a patient care coordinator) at 11:06am who advised her that her phone call that was the only one on record regarding her account. She would like to speak to someone to see what we are doing to help her on our end. Cb#[phone]

## 2022-03-14 ENCOUNTER — Ambulatory Visit
Admission: EM | Admit: 2022-03-14 | Discharge: 2022-03-14 | Disposition: A | Discharge status: Home / Self Care | Payer: BC Managed Care – PPO

## 2023-12-26 ENCOUNTER — Encounter: Payer: Self-pay | Admitting: Family Medicine

## 2023-12-26 ENCOUNTER — Ambulatory Visit: Payer: Self-pay | Admitting: Family Medicine

## 2023-12-26 DIAGNOSIS — Z113 Encounter for screening for infections with a predominantly sexual mode of transmission: Secondary | ICD-10-CM

## 2023-12-26 LAB — WET PREP FOR TRICH, YEAST, CLUE
Clue Cell Exam: NEGATIVE
Trichomonas Exam: NEGATIVE
Yeast Exam: NEGATIVE
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
## 2023-12-26 NOTE — Progress Notes (Signed)
 Wet prep reviewed - negative results. CHARLENA Satchel FNP notified. Burnadette Lowers, RN

## 2023-12-31 ENCOUNTER — Ambulatory Visit: Payer: Self-pay

## 2024-01-01 LAB — GONOCOCCUS CULTURE

## 2024-01-29 ENCOUNTER — Encounter: Payer: Self-pay | Admitting: Certified Nurse Midwife

## 2024-07-29 ENCOUNTER — Encounter: Payer: Self-pay | Admitting: Certified Nurse Midwife
